# Patient Record
Sex: Female | Born: 1965 | Race: White | Hispanic: No | Smoking: Former smoker
Health system: Southern US, Community
[De-identification: ages and names within clinical notes are randomized; demographics above are authoritative.]

## PROBLEM LIST (undated history)

## (undated) DIAGNOSIS — C4490 Unspecified malignant neoplasm of skin, unspecified: Secondary | ICD-10-CM

## (undated) HISTORY — PX: APPENDECTOMY: SHX54

## (undated) HISTORY — DX: Unspecified malignant neoplasm of skin, unspecified: C44.90

## (undated) HISTORY — PX: SKIN CANCER EXCISION: SHX779

---

## 1998-08-17 ENCOUNTER — Other Ambulatory Visit: Admission: RE | Admit: 1998-08-17 | Discharge: 1998-08-17 | Payer: Self-pay | Admitting: Obstetrics and Gynecology

## 1999-08-28 ENCOUNTER — Other Ambulatory Visit: Admission: RE | Admit: 1999-08-28 | Discharge: 1999-08-28 | Payer: Self-pay | Admitting: Obstetrics and Gynecology

## 2000-08-31 ENCOUNTER — Other Ambulatory Visit: Admission: RE | Admit: 2000-08-31 | Discharge: 2000-08-31 | Payer: Self-pay | Admitting: Obstetrics and Gynecology

## 2001-09-22 ENCOUNTER — Other Ambulatory Visit: Admission: RE | Admit: 2001-09-22 | Discharge: 2001-09-22 | Payer: Self-pay | Admitting: Family Medicine

## 2002-10-17 ENCOUNTER — Encounter (INDEPENDENT_AMBULATORY_CARE_PROVIDER_SITE_OTHER): Payer: Self-pay

## 2002-10-17 ENCOUNTER — Encounter: Payer: Self-pay | Admitting: Family Medicine

## 2002-10-17 ENCOUNTER — Inpatient Hospital Stay (HOSPITAL_COMMUNITY): Admission: RE | Admit: 2002-10-17 | Discharge: 2002-10-21 | Payer: Self-pay | Admitting: Family Medicine

## 2002-10-22 ENCOUNTER — Inpatient Hospital Stay (HOSPITAL_COMMUNITY): Admission: EM | Admit: 2002-10-22 | Discharge: 2002-10-28 | Payer: Self-pay | Admitting: Emergency Medicine

## 2002-10-22 ENCOUNTER — Encounter: Payer: Self-pay | Admitting: General Surgery

## 2002-10-23 ENCOUNTER — Encounter: Payer: Self-pay | Admitting: General Surgery

## 2002-10-26 ENCOUNTER — Encounter: Payer: Self-pay | Admitting: General Surgery

## 2003-01-23 ENCOUNTER — Other Ambulatory Visit: Admission: RE | Admit: 2003-01-23 | Discharge: 2003-01-23 | Payer: Self-pay | Admitting: Gynecology

## 2003-04-18 ENCOUNTER — Ambulatory Visit (HOSPITAL_COMMUNITY): Admission: RE | Admit: 2003-04-18 | Discharge: 2003-04-18 | Payer: Self-pay | Admitting: Gynecology

## 2003-08-21 ENCOUNTER — Encounter (INDEPENDENT_AMBULATORY_CARE_PROVIDER_SITE_OTHER): Payer: Self-pay | Admitting: Specialist

## 2003-08-21 ENCOUNTER — Inpatient Hospital Stay (HOSPITAL_COMMUNITY): Admission: AD | Admit: 2003-08-21 | Discharge: 2003-08-24 | Payer: Self-pay | Admitting: Gynecology

## 2003-10-05 ENCOUNTER — Other Ambulatory Visit: Admission: RE | Admit: 2003-10-05 | Discharge: 2003-10-05 | Payer: Self-pay | Admitting: Gynecology

## 2004-11-08 ENCOUNTER — Ambulatory Visit (HOSPITAL_COMMUNITY): Admission: RE | Admit: 2004-11-08 | Discharge: 2004-11-08 | Payer: Self-pay | Admitting: Gynecology

## 2004-11-08 ENCOUNTER — Other Ambulatory Visit: Admission: RE | Admit: 2004-11-08 | Discharge: 2004-11-08 | Payer: Self-pay | Admitting: Gynecology

## 2005-11-11 ENCOUNTER — Other Ambulatory Visit: Admission: RE | Admit: 2005-11-11 | Discharge: 2005-11-11 | Payer: Self-pay | Admitting: Gynecology

## 2006-05-14 ENCOUNTER — Other Ambulatory Visit: Admission: RE | Admit: 2006-05-14 | Discharge: 2006-05-14 | Payer: Self-pay | Admitting: Gynecology

## 2006-11-18 ENCOUNTER — Other Ambulatory Visit: Admission: RE | Admit: 2006-11-18 | Discharge: 2006-11-18 | Payer: Self-pay | Admitting: Gynecology

## 2006-11-30 ENCOUNTER — Ambulatory Visit (HOSPITAL_COMMUNITY): Admission: RE | Admit: 2006-11-30 | Discharge: 2006-11-30 | Payer: Self-pay | Admitting: Gynecology

## 2006-12-11 ENCOUNTER — Encounter: Admission: RE | Admit: 2006-12-11 | Discharge: 2006-12-11 | Payer: Self-pay | Admitting: Gynecology

## 2007-11-26 ENCOUNTER — Other Ambulatory Visit: Admission: RE | Admit: 2007-11-26 | Discharge: 2007-11-26 | Payer: Self-pay | Admitting: Gynecology

## 2007-12-06 ENCOUNTER — Other Ambulatory Visit: Admission: RE | Admit: 2007-12-06 | Discharge: 2007-12-06 | Payer: Self-pay | Admitting: Gynecology

## 2010-11-08 ENCOUNTER — Other Ambulatory Visit: Admission: RE | Admit: 2010-11-08 | Discharge: 2010-11-08 | Payer: Self-pay | Admitting: Gynecology

## 2010-11-08 ENCOUNTER — Encounter: Admission: RE | Admit: 2010-11-08 | Discharge: 2010-11-08 | Payer: Self-pay | Admitting: Gynecology

## 2010-11-08 ENCOUNTER — Ambulatory Visit: Payer: Self-pay | Admitting: Gynecology

## 2010-11-29 ENCOUNTER — Encounter
Admission: RE | Admit: 2010-11-29 | Discharge: 2010-11-29 | Payer: Self-pay | Source: Home / Self Care | Attending: Gynecology | Admitting: Gynecology

## 2011-01-12 ENCOUNTER — Encounter: Payer: Self-pay | Admitting: Gynecology

## 2011-05-09 NOTE — H&P (Signed)
NAME:  Julia Dunlap, Julia Dunlap                            ACCOUNT NO.:  [id number]   MEDICAL RECORD NO.:  [id number]                   PATIENT TYPE:  INP   LOCATION:  NA                                   FACILITY:  [facility]   PHYSICIAN:  Juan H. Lily Peer, M.D.             DATE OF BIRTH:  1966/07/29   DATE OF ADMISSION:  08/21/2003  DATE OF DISCHARGE:                                HISTORY & PHYSICAL   CHIEF COMPLAINT:  1. Term intrauterine pregnancy.  2. Previous cesarean section, request for elective repeat.  3. Request for elective tubal sterilization.  4. Advanced maternal age.   HISTORY:  The patient is a 45 year old, gravida 2, para 1 (history of twins  past pregnancy, one delivered vaginal and the second delivered by cesarean  section).  By ultrasound corrected estimated date of confinement August 27, 2003.  The patient will be [redacted] weeks gestation at the time of her elective  cesarean section.  The patient had requested that she have a tubal  sterilization the same time.  The risks, benefits, pros, and cons of  sterilization were discussed.  She understood this was permanent.  Her  prenatal course was significant for the fact that because of advanced  maternal age she underwent genetic amniocentesis with normal chromosome  studies demonstrating a normal female.  The patient also had an upper  respiratory tract infection in her third trimester and was treated with  Augmentin.  She had been counseled on numerous occasions because she smoked  a half of a packs of cigarettes per day.  Otherwise her prenatal course was  essentially unremarkable with the exception that she has positive group B  Streptococcus culture documented.   PAST MEDICAL HISTORY:  Patient with one vaginal delivery and one cesarean  section and delivery of twins in 36.  Advanced maternal age.  Smoker of  less than a half a packs of cigarettes per day.   PAST SURGICAL HISTORY:  She has had an appendectomy in the past.   Otherwise  she has been healthy.   ALLERGIES:  She denies any allergies.   REVIEW OF SYSTEMS:  See [place] form.   PHYSICAL EXAMINATION:  VITAL SIGNS:  Blood pressure 130/70.  WEIGHT:  173 pounds.  HEENT:  Unremarkable.  NECK:  Supple.  Trachea midline.  No carotid bruits.  No thyromegaly.  LUNGS:  Clear to auscultation without any rhonchi or wheezes.  HEART:  Regular rate and rhythm.  No murmurs or gallops.  BREASTS:  Exam not done.  ABDOMEN:  Gravid uterus.  Fundal height 3.75 cm.  Vertex presentation by  [facility] maneuver.  PELVIC:  Her cervix was long, closed, and posterior.  EXTREMITIES:  DTRs 1+.  Negative clonus.  Trace edema.   LABORATORY DATA:  Urine with trace protein and negative for glucose.  Prenatal labs with O positive blood type, negative antibiotic screen, VDRL  nonreactive, rubella immune, hepatitis B surface antigen and HIV negative,  alpha-fetoprotein normal, diabetes screen normal, and positive GBS culture.   ASSESSMENT:  A 45 year old, gravida 2, para 1 (last pregnancy twin gestation  with twin A delivered vaginally and twin B by cesarean section).  The  patient is now at 85 weeks estimated gestational age.  Request for elective  repeat cesarean section and request for elective permanent sterilization.  The risks, benefits, pros, cons, and failure rate of both operations were  discussed.  All questions were answered.  Will follow accordingly.   PLAN:  The patient is scheduled for a repeat cesarean section with bilateral  tubal sterilization on Monday, August 21, 2003, at 1 p.m.                                               Juan H. Lily Peer, M.D.    JHF/MEDQ  D:  08/18/2003  T:  08/18/2003  Job:  [id number]

## 2011-05-09 NOTE — Op Note (Signed)
NAME:  Julia Dunlap, Julia Dunlap                            ACCOUNT NO.:  [id number]   MEDICAL RECORD NO.:  [id number]                   PATIENT TYPE:  INP   LOCATION:  0443                                 FACILITY:  [facility]   PHYSICIAN:  Ollen Gross. Vernell Morgans, M.D.              DATE OF BIRTH:  June 27, 1966   DATE OF PROCEDURE:  10/17/2002  DATE OF DISCHARGE:                                 OPERATIVE REPORT   PREOPERATIVE DIAGNOSIS:  Appendicitis.   POSTOPERATIVE DIAGNOSIS:  Contained ruptured appendicitis.   SURGEON:  Ollen Gross. Carolynne Edouard, M.D.   ANESTHESIA:  General endotracheal.   DESCRIPTION OF PROCEDURE:  After informed consent was obtained, the patient  was brought to the operating room and placed in a supine position on the  operating room table.  After adequate induction of general anesthesia, the  patient's abdomen was prepped with Betadine and draped in the usual sterile  manner.  The area below the umbilicus was infiltrated with 0.25% Marcaine.  A small incision was made with the 15 blade knife.  This incision was  carried down through the subcutaneous tissue bluntly using Kelly clamps and  [employer] retractors until the linea alba was identified.  The linea alba  was also incised with the 15 blade knife, and each side was grasped with  Kocher clamps and elevated anteriorly.  The preperitoneal space was then  probed bluntly with a hemostat until the peritoneum was opened and access  was gained to the abdominal cavity.  A 0 Vicryl pursestring stitch was  placed in the fascia surrounding this opening.  A laparoscope was then  placed through this opening and anchored in place with the previously-placed  Vicryl pursestring stitch.  The abdomen was then able to be insufflated  without difficulty with carbon dioxide.  The patient was placed in a  Trendelenburg position with the left side down slightly.  A small stab  incision was made in the suprapubic area with a 15 blade knife, and a 5 mm  port was  then placed bluntly through this incision into the abdominal cavity  under direct vision.  The right lower quadrant was inspected, and an  inflammatory mass was able to be appreciated, but it was obvious that it was  not going to be possible to visualize or mobilize the appendix  laparoscopically.  At this point, the laparoscopic portion of the procedure  was abandoned, and the laparoscope was used to approximate the placement of  an incision in the right lower quadrant for an open appendectomy.  Once this  was done, a right lower quadrant transverse incision was made with a 15  blade knife.  This incision was carried down through the skin and  subcutaneous tissues using the Bovie electrocautery until the fascial layers  of the external oblique were encountered.  These fascial layers and muscle  layers were opened sharply with the electrocautery  and carrying it down  through the internal and transversalis layers as well until the peritoneum  was encountered.  The peritoneum was grasped between hemostats and opened  sharply with the Metzenbaum scissors.  The rest of the incision was then  opened under direct vision with the electrocautery.  The right lower  quadrant was inspected, and there was an obvious inflammatory phlegmon in  this area.  The appendix appeared to be located in a retrocecal position in  the retroperitoneum.  The proximal part of the right colon was mobilized  somewhat from its retroperitoneal attachments sharply with the  electrocautery and then as well as bluntly with some finger dissection.  Once this was accomplished, the course of the appendix was able to be  appreciated.  The appendix was freed up by incising some of the inflammatory  rind around it with the harmonic scalpel.  Once this was accomplished, the  appendix was able to be freed somewhat, and the base of the appendix at its  junction with the cecum was able to be identified.  This area was cleared of  any  inflammatory debris sharply with the harmonic scalpel.  Next, an EndoGIA  stapler for standard tissue was used and applied across the base of the  appendix and clamped and fired.  The appendix was divided and removed from  the patient.  The staple line on the cecum was intact and appeared to be in  good position.  The abdomen was then irrigated with copious amounts of  saline, and the fascial layers were then closed in two layers using running  #1 PDS sutures.  Each layer was irrigated copiously with saline.  The  subcutaneous tissue was then left open and packed with gauze.  The prior  laparoscopic incisions were closed with staples.  Sterile dressings were  applied.  The patient tolerated the procedure well.  At the end of the case,  all needle, sponge, and instrument counts were correct.  The patient was  awakened and taken to the recovery room in stable condition.October 20, 2002                                               Ollen Gross. Vernell Morgans, M.D.    PST/MEDQ  D:  10/21/2002  T:  10/21/2002  Job:  [id number]

## 2011-05-09 NOTE — Discharge Summary (Signed)
   NAME:  Julia Dunlap, Julia Dunlap                            ACCOUNT NO.:  [id number]   MEDICAL RECORD NO.:  [id number]                   PATIENT TYPE:  INP   LOCATION:  0442                                 FACILITY:  [facility]   PHYSICIAN:  Ollen Gross. Vernell Morgans, M.D.              DATE OF BIRTH:  [birth year]   DATE OF ADMISSION:  10/22/2002  DATE OF DISCHARGE:  10/28/2002                                 DISCHARGE SUMMARY   HOSPITAL COURSE:  Briefly, the patient is a 45 year old white female who was  several days status post open appendectomy when she developed some redness  and worsening pain on her right flank.  She was brought back into the  hospital, underwent a CT scan that showed two abscess pockets.  The intra-  abdominal pocket was drained percutaneously.  The abscess pocket in her  anterior abdominal wall communicated with the incision and was able to be  opened through the incision and drained.  She was started on broad-spectrum  antibiotics and was kept on these antibiotics with the drain in place for  several days.  Once her drain output had decreased the drain was moved, and  by October 28, 2002, she was tolerating a diet.  Her drain was out, and she  was ready for discharge home.   DISCHARGE MEDICATIONS:  Cipro.   ACTIVITY:  No heavy lifting.   DIET:  No restrictions.   WOUND CARE:  She is to continue getting home health dressing changes.   FOLLOW-UP:  In one week to Dr. Carolynne Edouard.   FINAL DIAGNOSIS:  Intra-abdominal abscess after open appendectomy.   DISPOSITION:  Home.   CONDITION ON DISCHARGE:  Stable.                                                Ollen Gross. Vernell Morgans, M.D.    PST/MEDQ  D:  11/08/2002  T:  11/08/2002  Job:  [id number]

## 2011-05-09 NOTE — Discharge Summary (Signed)
   NAME:  Julia Dunlap, Julia Dunlap                            ACCOUNT NO.:  [id number]   MEDICAL RECORD NO.:  [id number]                   PATIENT TYPE:  INP   LOCATION:  0443                                 FACILITY:  [facility]   PHYSICIAN:  Ollen Gross. Vernell Morgans, M.D.              DATE OF BIRTH:  [birth year]   DATE OF ADMISSION:  10/17/2002  DATE OF DISCHARGE:  10/21/2002                                 DISCHARGE SUMMARY   HOSPITAL COURSE:  Briefly, the patient is a 45 year old white female who  presented with abdominal pain and was found by CT scan to have evidence of  appendicitis with possible rupture.  She also had a white count of 31,000.  She was taken to the operating room where she underwent an open appendectomy  for what was found to be a ruptured appendicitis.  She tolerated this  procedure well.  Her wound was left open with dressing changes, and she was  placed on broad-spectrum antibiotics.  Over the next couple of days her  white count normalized, and she had continued to improve.  She was  tolerating a diet, and her bowels were moving well, and she was comfortable  with her dressing changes.  On October 21, 2002, she was ready for discharge  home.   FINAL DIAGNOSIS:  Ruptured acute appendicitis.   ACTIVITY:  No heavy lifting.   MEDICATIONS:  She is to resume her home medications.  She was given a  prescription for Vicodin for pain.   CONDITION ON DISCHARGE:  Stable.   FOLLOW-UP:  Will be with Dr. Carolynne Edouard in the next week.   DISPOSITION:  She is ready for discharge home.                                               Ollen Gross. Vernell Morgans, M.D.    PST/MEDQ  D:  11/08/2002  T:  11/08/2002  Job:  [id number]

## 2011-05-09 NOTE — Op Note (Signed)
NAME:  Julia Dunlap, Julia Dunlap                            ACCOUNT NO.:  [id number]   MEDICAL RECORD NO.:  [id number]                   PATIENT TYPE:  INP   LOCATION:  9105                                 FACILITY:  [facility]   PHYSICIAN:  Juan H. Lily Peer, M.D.             DATE OF BIRTH:  08/22/1966   DATE OF PROCEDURE:  08/21/2003  DATE OF DISCHARGE:                                 OPERATIVE REPORT   PREOPERATIVE DIAGNOSIS:  1. Term intrauterine pregnancy.  2. Previous cesarean section requesting elective repeat.  3. Request for permanent sterilization.   POSTOPERATIVE DIAGNOSIS:  1. Term intrauterine pregnancy.  2. Previous cesarean section requesting elective repeat.  3. Request for permanent sterilization.   OPERATION PERFORMED:  1. Repeat lower uterine segment transverse cesarean section.  2. Bilateral tubal sterilization procedure, Pomeroy technique.   SURGEON:  Juan H. Lily Peer, M.D.   ASSISTANT:  Ivor Costa. Farrel Gobble, M.D.   ANESTHESIA:  Epidural.   FINDINGS:  A viable female infant, Apgars of 8 and 9.  Clear amniotic fluid,  normal maternal pelvic anatomy.   INDICATIONS FOR PROCEDURE:  The patient is a 45 year old with previous  cesarean section at time of delivery of twins, first twin delivered  vaginally, second twin via cesarean section.  Patient requesting elective  repeat cesarean section  and elective permanent sterilization.   DESCRIPTION OF PROCEDURE:  After the patient was adequately counseled, she  was taken to the operating room where she underwent a successful spinal  anesthesia.  The abdomen was prepped and draped in the usual sterile  fashion.  A Foley catheter had been inserted in effort to monitor urinary  output.  A Pfannenstiel incision was made adjacent to the previous  Pfannenstiel scar.  The incision was carried down from the skin and  subcutaneous tissue down to the rectus fascia.  A midline nick was made. The  fascia was incised in a transverse fashion.   The peritoneal cavity was  entered cautiously.  The bladder flap was established.  The lower uterine  segment was incised in a transverse fashion.  Clear amniotic fluid was  present.  The newborn's head was delivered, nuchal cord times one was  manually reduced.  After the newborn was delivered, the nasopharyngeal area  was bulb suctioned. The cord was doubly clamped and excised and passed off  to the pediatricians who were in attention who gave the above mentioned  parameters.  The placenta was delivered from the intrauterine cavity and  three-vessel cord.  The uterus was then exteriorized, the intrauterine  cavity was cleared of remaining products of conception.  The uterus was  closed in a single locking stitch fashion with 0 Vicryl suture.  The uterus,  tubes and ovaries were inspected, normal size, shape for pregnancy but no  abnormalities were noted.  The uterus was then placed back into the pelvic  cavity.  The pelvic cavity  was then copiously irrigated with normal saline  solution.  After ascertaining adequate hemostasis, the visceral peritoneum  was not reapproximated but the fascia was closed with a running stitch of 0  Vicryl suture after ascertaining that the sponge and needle count were  correct.  The subcutaneous layers were Bovie cauterized.  The skin was  reapproximated with skin clips, followed by placement of Xeroform gauze and  4 x 8 dressing.  The patient was transferred to recovery room with stable  vital signs.   ESTIMATED BLOOD LOSS:  .   URINE OUTPUT:  .   IV FLUID:  lactated Ringers.   ANTIBIOTICS:  The patient received 2g of Ancef.   Of note, the weight of the newborn was 6 pounds, 6 ounces.                                               Juan H. Lily Peer, M.D.    JHF/MEDQ  D:  08/22/2003  T:  08/22/2003  Job:  [id number]

## 2011-05-09 NOTE — Discharge Summary (Signed)
   NAME:  Julia Dunlap, Julia Dunlap                            ACCOUNT NO.:  [id number]   MEDICAL RECORD NO.:  [id number]                   PATIENT TYPE:  INP   LOCATION:  9105                                 FACILITY:  [facility]   PHYSICIAN:  Juan H. Lily Peer, M.D.             DATE OF BIRTH:  [birth year]   DATE OF ADMISSION:  08/21/2003  DATE OF DISCHARGE:  08/24/2003                                 DISCHARGE SUMMARY   DISCHARGE DIAGNOSES:  1. Intrauterine pregnancy at 39 weeks delivered.  2. History of prior cesarean section, for repeat cesarean section.  3. Desired attempt at permanent sterilization.  4. Advanced maternal age.  5. Status post repeat lower uterine segment transverse cesarean section,     bilateral tubal sterilization procedure Pomeroy technique by Dr. Reynaldo Minium on August 21, 2003.  6. Positive group B strep.   HISTORY:  This is a 36-years-of-age female gravida 2 para 1 (twins) with an  EDC of August 27, 2003.  Prenatal course had been complicated by history  of twin delivery - one delivered vaginally and the second delivered by  cesarean section.  The patient desired a repeat cesarean section; also  desired attempt at permanent sterilization.  Was advanced maternal age and  underwent genetic amniocentesis which revealed normal chromosomes.  Also is  a smoker and had been counseled multiple times to discontinue smoking.  She  also had a history of positive group B strep.   HOSPITAL COURSE:  On August 21, 2003 the patient was admitted and underwent  a repeat lower uterine segment transverse cesarean section and bilateral  tubal sterilization procedure - Pomeroy technique by Dr. Reynaldo Minium and  underwent delivery of a female, Apgars of 8 and 9, weight of 6 pounds 6  ounces.  There were no complications.  Postoperatively the patient remained  afebrile, voiding, in stable condition.  She was discharged to home on  August 24, 2003 and given [facility]  postpartum instructions  and postpartum booklet.   ACCESSORY CLINICAL FINDINGS/LABORATORY DATA:  The patient is O positive,  rubella immune.  On August 22, 2003 hemoglobin was 11.7.   DISPOSITION:  The patient is discharged to home.  Informed to return to the  office in six weeks; if had any problem prior to that time to be seen in the  office.  Given a prescription for Tylox p.r.n. pain.     Susa Loffler, P.A.                    Juan H. Lily Peer, M.D.    TSG/MEDQ  D:  09/22/2003  T:  09/22/2003  Job:  [id number]

## 2012-06-30 ENCOUNTER — Encounter (HOSPITAL_COMMUNITY): Payer: Self-pay | Admitting: Emergency Medicine

## 2012-06-30 ENCOUNTER — Emergency Department (HOSPITAL_COMMUNITY)
Admission: EM | Admit: 2012-06-30 | Discharge: 2012-07-01 | Disposition: A | Discharge status: Home / Self Care | Payer: BC Managed Care – PPO | Attending: Emergency Medicine | Admitting: Emergency Medicine

## 2012-06-30 DIAGNOSIS — R259 Unspecified abnormal involuntary movements: Secondary | ICD-10-CM | POA: Insufficient documentation

## 2012-06-30 DIAGNOSIS — R112 Nausea with vomiting, unspecified: Secondary | ICD-10-CM | POA: Insufficient documentation

## 2012-06-30 DIAGNOSIS — F411 Generalized anxiety disorder: Secondary | ICD-10-CM | POA: Insufficient documentation

## 2012-06-30 DIAGNOSIS — F1020 Alcohol dependence, uncomplicated: Secondary | ICD-10-CM

## 2012-06-30 DIAGNOSIS — R000 Tachycardia, unspecified: Secondary | ICD-10-CM | POA: Insufficient documentation

## 2012-06-30 DIAGNOSIS — IMO0002 Reserved for concepts with insufficient information to code with codable children: Secondary | ICD-10-CM | POA: Insufficient documentation

## 2012-06-30 LAB — COMPREHENSIVE METABOLIC PANEL
ALT: 75 U/L — ABNORMAL HIGH (ref 0–35)
AST: 138 U/L — ABNORMAL HIGH (ref 0–37)
Albumin: 4.5 g/dL (ref 3.5–5.2)
Alkaline Phosphatase: 91 U/L (ref 39–117)
Potassium: 3.2 mEq/L — ABNORMAL LOW (ref 3.5–5.1)
Sodium: 138 mEq/L (ref 135–145)
Total Protein: 8.3 g/dL (ref 6.0–8.3)

## 2012-06-30 LAB — URINE MICROSCOPIC-ADD ON

## 2012-06-30 LAB — CBC WITH DIFFERENTIAL/PLATELET
Basophils Relative: 2 % — ABNORMAL HIGH (ref 0–1)
Eosinophils Absolute: 0 10*3/uL (ref 0.0–0.7)
MCH: 34.3 pg — ABNORMAL HIGH (ref 26.0–34.0)
MCHC: 36 g/dL (ref 30.0–36.0)
Neutrophils Relative %: 54 % (ref 43–77)
Platelets: 271 10*3/uL (ref 150–400)
RBC: 4.72 MIL/uL (ref 3.87–5.11)

## 2012-06-30 LAB — RAPID URINE DRUG SCREEN, HOSP PERFORMED
Amphetamines: NOT DETECTED
Opiates: NOT DETECTED
Tetrahydrocannabinol: NOT DETECTED

## 2012-06-30 LAB — URINALYSIS, ROUTINE W REFLEX MICROSCOPIC
Glucose, UA: NEGATIVE mg/dL
Ketones, ur: 40 mg/dL — AB
Leukocytes, UA: NEGATIVE
Protein, ur: >300 mg/dL — AB

## 2012-06-30 LAB — ETHANOL
Alcohol, Ethyl (B): 388 mg/dL — ABNORMAL HIGH (ref 0–11)
Alcohol, Ethyl (B): 208 mg/dL — ABNORMAL HIGH (ref 0–11)

## 2012-06-30 MED ORDER — SODIUM CHLORIDE 0.9 % IV BOLUS (SEPSIS)
1000.0000 mL | Freq: Once | INTRAVENOUS | Status: AC
  Administered 2012-06-30: 1000 mL via INTRAVENOUS

## 2012-06-30 MED ORDER — POTASSIUM CHLORIDE CRYS ER 20 MEQ PO TBCR
40.0000 meq | EXTENDED_RELEASE_TABLET | Freq: Once | ORAL | Status: AC
  Administered 2012-06-30: 40 meq via ORAL
  Filled 2012-06-30: qty 2

## 2012-06-30 MED ORDER — LORAZEPAM 1 MG PO TABS: 0.0000 mg | ORAL_TABLET | Freq: Two times a day (BID) | ORAL | Status: DC

## 2012-06-30 MED ORDER — LORAZEPAM 2 MG/ML IJ SOLN: 1.0000 mg | Freq: Four times a day (QID) | INTRAMUSCULAR | Status: DC | PRN

## 2012-06-30 MED ORDER — LORAZEPAM 1 MG PO TABS
0.0000 mg | ORAL_TABLET | Freq: Four times a day (QID) | ORAL | Status: DC
  Administered 2012-06-30 – 2012-07-01 (×2): 1 mg via ORAL
  Filled 2012-06-30: qty 1

## 2012-06-30 MED ORDER — ONDANSETRON HCL 4 MG PO TABS
4.0000 mg | ORAL_TABLET | Freq: Three times a day (TID) | ORAL | Status: DC | PRN
  Administered 2012-06-30: 4 mg via ORAL
  Filled 2012-06-30: qty 1

## 2012-06-30 MED ORDER — LORAZEPAM 1 MG PO TABS: 1.0000 mg | ORAL_TABLET | Freq: Four times a day (QID) | ORAL | Status: DC | PRN

## 2012-06-30 MED ORDER — FOLIC ACID 1 MG PO TABS
1.0000 mg | ORAL_TABLET | Freq: Every day | ORAL | Status: DC
  Administered 2012-06-30: 1 mg via ORAL
  Filled 2012-06-30: qty 1

## 2012-06-30 MED ORDER — ACETAMINOPHEN 500 MG PO TABS
1000.0000 mg | ORAL_TABLET | Freq: Once | ORAL | Status: AC
  Administered 2012-06-30: 1000 mg via ORAL
  Filled 2012-06-30: qty 2

## 2012-06-30 MED ORDER — IBUPROFEN 400 MG PO TABS
600.0000 mg | ORAL_TABLET | Freq: Three times a day (TID) | ORAL | Status: DC | PRN
  Administered 2012-06-30: 600 mg via ORAL
  Filled 2012-06-30: qty 2

## 2012-06-30 MED ORDER — LORAZEPAM 1 MG PO TABS
1.0000 mg | ORAL_TABLET | Freq: Once | ORAL | Status: AC
  Filled 2012-06-30: qty 1

## 2012-06-30 MED ORDER — THIAMINE HCL 100 MG/ML IJ SOLN: 100.0000 mg | Freq: Every day | INTRAMUSCULAR | Status: DC

## 2012-06-30 MED ORDER — VITAMIN B-1 100 MG PO TABS
100.0000 mg | ORAL_TABLET | Freq: Every day | ORAL | Status: DC
  Administered 2012-06-30: 100 mg via ORAL
  Filled 2012-06-30: qty 1

## 2012-06-30 MED ORDER — ADULT MULTIVITAMIN W/MINERALS CH
1.0000 | ORAL_TABLET | Freq: Every day | ORAL | Status: DC
  Administered 2012-06-30: 1 via ORAL
  Filled 2012-06-30: qty 1

## 2012-06-30 NOTE — ED Notes (Signed)
Patient states that she is an alcoholic, ready to get help and quit. States her last drink wasn't long ago. States drinks approximately a fifth a day.

## 2012-06-30 NOTE — ED Notes (Signed)
Patient resting quietly, denies any discomfort, tearful at times.  Patient reassured, family at bedside.

## 2012-06-30 NOTE — ED Notes (Signed)
Patient states she vomited x1, but feels fine now.  No evidence of vomit in room.

## 2012-06-30 NOTE — ED Notes (Signed)
Act team member assessing patient at this time.

## 2012-06-30 NOTE — ED Provider Notes (Signed)
History   This chart was scribed for Julia Octave, MD by Julia Dunlap. The patient was seen in room APA15/APA15. Patient's care was started at [facility].     CSN: [id number]  Arrival date & time 06/30/12  Julia Dunlap   First MD Initiated Contact with Patient 06/30/12 1854      No chief complaint on file.   (Consider location/radiation/quality/duration/timing/severity/associated sxs/prior treatment) Patient is a 46 y.o. female presenting with drug/alcohol assessment. The history is provided by the patient. No language interpreter was used.  Drug / Alcohol Assessment Primary symptoms include agitation and intoxication. This is a chronic problem. The current episode started 6 to 12 hours ago. The problem has not changed since onset.Suspected agents include alcohol. Associated symptoms include nausea and vomiting. Pertinent negatives include no fever.   Julia Dunlap is a 46 y.o. female who presents to the Emergency Department needing alcohol detoxication. Patient says that she drinks about 1/5 of liquor daily. Patient's family reports that patient has had at least one episode of emesis today. Patient says she has never quit drinking before. Patient reports that she has been drinking for years. Patient has no other medical problems and does not take any medicines. Patient says that she lives with her husband and that he contributes to her alcohol abuse. Patient denies suicidal or homicidal ideation. Patient denies hallucinations or hearing voices. Patient with  No past medical history on file.  No past surgical history on file.  No family history on file.  History  Substance Use Topics  . Smoking status: Not on file  . Smokeless tobacco: Not on file  . Alcohol Use: Not on file    OB History    No data available      Review of Systems  Constitutional: Negative for fever.  Gastrointestinal: Positive for nausea and vomiting.  Psychiatric/Behavioral: Positive for agitation.    Allergies    Review of patient's allergies indicates not on file.  Home Medications  No current outpatient prescriptions on file.  There were no vitals taken for this visit.  Physical Exam  Nursing note and vitals reviewed. Constitutional: She is oriented to person, place, and time. She appears well-developed and well-nourished.       Anxious  HENT:  Head: Normocephalic and atraumatic.  Eyes: Conjunctivae and EOM are normal. Pupils are equal, round, and reactive to light.  Neck: Normal range of motion. Neck supple.  Cardiovascular: Normal rate and regular rhythm.   Pulmonary/Chest: Effort normal and breath sounds normal.  Abdominal: Soft. Bowel sounds are normal.  Musculoskeletal: Normal range of motion.  Neurological: She is alert and oriented to person, place, and time. She displays tremor. No cranial nerve deficit or sensory deficit. Coordination normal.       Mild tremors. No asterixis. No focal neural deficits. No nystagmus. No ankle clonus.  Skin: Skin is warm and dry.  Psychiatric: Her mood appears anxious.    ED Course  Procedures (including critical care time) DIAGNOSTIC STUDIES: Oxygen Saturation is 99% on room air, normal by my interpretation.    COORDINATION OF CARE: 7:15PM- Patient informed of current plan for treatment and evaluation and agrees with plan at this time.  Results for orders placed during the hospital encounter of 06/30/12  CBC WITH DIFFERENTIAL      Component Value Range   WBC 8.0  4.0 - 10.5 K/uL   RBC 4.72  3.87 - 5.11 MIL/uL   Hemoglobin 16.2 (*) 12.0 - 15.0 g/dL   HCT 45.0  36.0 - 46.0 %   MCV 95.3  78.0 - 100.0 fL   MCH 34.3 (*) 26.0 - 34.0 pg   MCHC 36.0  30.0 - 36.0 g/dL   RDW 72.5  36.6 - 44.0 %   Platelets 271  150 - 400 K/uL   Neutrophils Relative 54  43 - 77 %   Neutro Abs 4.3  1.7 - 7.7 K/uL   Lymphocytes Relative 37  12 - 46 %   Lymphs Abs 3.0  0.7 - 4.0 K/uL   Monocytes Relative 7  3 - 12 %   Monocytes Absolute 0.6  0.1 - 1.0 K/uL    Eosinophils Relative 1  0 - 5 %   Eosinophils Absolute 0.0  0.0 - 0.7 K/uL   Basophils Relative 2 (*) 0 - 1 %   Basophils Absolute 0.1  0.0 - 0.1 K/uL  COMPREHENSIVE METABOLIC PANEL      Component Value Range   Sodium 138  135 - 145 mEq/L   Potassium 3.2 (*) 3.5 - 5.1 mEq/L   Chloride 94 (*) 96 - 112 mEq/L   CO2 17 (*) 19 - 32 mEq/L   Glucose, Bld 98  70 - 99 mg/dL   BUN 11  6 - 23 mg/dL   Creatinine, Ser 3.47  0.50 - 1.10 mg/dL   Calcium 9.4  8.4 - 42.5 mg/dL   Total Protein 8.3  6.0 - 8.3 g/dL   Albumin 4.5  3.5 - 5.2 g/dL   AST 956 (*) 0 - 37 U/L   ALT 75 (*) 0 - 35 U/L   Alkaline Phosphatase 91  39 - 117 U/L   Total Bilirubin 0.6  0.3 - 1.2 mg/dL   GFR calc non Af Amer >90  >90 mL/min   GFR calc Af Amer >90  >90 mL/min  URINE RAPID DRUG SCREEN (HOSP PERFORMED)      Component Value Range   Opiates NONE DETECTED  NONE DETECTED   Cocaine NONE DETECTED  NONE DETECTED   Benzodiazepines NONE DETECTED  NONE DETECTED   Amphetamines NONE DETECTED  NONE DETECTED   Tetrahydrocannabinol NONE DETECTED  NONE DETECTED   Barbiturates NONE DETECTED  NONE DETECTED  ETHANOL      Component Value Range   Alcohol, Ethyl (B) 388 (*) 0 - 11 mg/dL  URINALYSIS, ROUTINE W REFLEX MICROSCOPIC      Component Value Range   Color, Urine AMBER (*) YELLOW   APPearance CLEAR  CLEAR   Specific Gravity, Urine 1.025  1.005 - 1.030   pH 6.0  5.0 - 8.0   Glucose, UA NEGATIVE  NEGATIVE mg/dL   Hgb urine dipstick MODERATE (*) NEGATIVE   Bilirubin Urine SMALL (*) NEGATIVE   Ketones, ur 40 (*) NEGATIVE mg/dL   Protein, ur >387 (*) NEGATIVE mg/dL   Urobilinogen, UA 0.2  0.0 - 1.0 mg/dL   Nitrite NEGATIVE  NEGATIVE   Leukocytes, UA NEGATIVE  NEGATIVE  PREGNANCY, URINE      Component Value Range   Preg Test, Ur NEGATIVE  NEGATIVE  URINE MICROSCOPIC-ADD ON      Component Value Range   Squamous Epithelial / LPF MANY (*) RARE   WBC, UA 3-6  <3 WBC/hpf   RBC / HPF 0-2  <3 RBC/hpf   Bacteria, UA FEW (*) RARE     No results found.   No diagnosis found.    MDM  History of alcohol abuse requesting detox. Drinks a fifth of liquor daily for several  years. Last drink just prior to arrival. No history of seizures.  Tachycardic, mildly tremulous on exam, anxious  Labs, Alcohol withdrawal protocol, d/w ACT team.  D/w Samson Frederic.  She will work on placement but suspects alcohol level will need to be less than 100 before patient can be placed.   Date: 06/30/2012  Rate: 116  Rhythm: sinus tachycardia  QRS Axis: normal  Intervals: normal  ST/T Wave abnormalities: normal  Conduction Disutrbances:none  Narrative Interpretation: rate faster  Old EKG Reviewed: changes noted    I personally performed the services described in this documentation, which was scribed in my presence.  The recorded information has been reviewed and considered.    Julia Octave, MD 06/30/12 2052

## 2012-06-30 NOTE — ED Notes (Signed)
Patients belongings removed and placed in locker patient placed in paper scrubs.  Room secured. Family at bedside at this time. No needs or complaints voiced by patient at this time. Asked patient to obtain a urine sample, patient responded "in a little bit". Security contacted to wand patient at this time.

## 2012-06-30 NOTE — ED Notes (Signed)
Patient states she is agreeable to plan of detox.

## 2012-06-30 NOTE — BH Assessment (Signed)
Assessment Note   Julia Dunlap is an 46 y.o. female. PT PRESENTS REQUESTING ETOH DETOX  AFTER BEING DEPENDENT ON FOR SEVERAL YEARS. PT EXPRESSED SHE WAS OFF TODAY & COULD NOT GET HERSELF TOGETHER TO TAKE HER CHILDREN TO W[facility]-N-WILD DUE TO HER DRINKING. PT DENIES ANY HX OF TX OR IDEATION & IS ABLE TO CONTRACT FOR SAFETY. PT ADMITS DRINKING AT LEAST A PINT TO 5TH DAILY & LAST DRINK WAS 06/30/12 WHICH SHE DRANK A PINT. PARENTS ARE SUPPORTIVE. PT HAS BEEN REFERRED TO [facility]; PENDING DISPOSITION. PT EXPRESSED THAT SHE HAD TO WORK & IS INDECISIVE IF SHE STILL WANT DETOX. PT'S BAL IS 388 & [facility] WANTS IT BE UNDER 200 BEFORE ACCEPTED.  Axis I: Anxiety Disorder NOS, Substance Induced Mood Disorder and ALCOHOL DEPEENDENCE Axis II: Deferred Axis III: History reviewed. No pertinent past medical history. Axis IV: other psychosocial or environmental problems, problems related to social environment and problems with primary support group Axis V: 41-50 serious symptoms  Past Medical History: History reviewed. No pertinent past medical history.  Past Surgical History  Procedure Date  . Appendectomy   . Cesarean section     Family History: History reviewed. No pertinent family history.  Social History:  reports that she has been smoking Cigarettes.  She does not have any smokeless tobacco history on file. She reports that she drinks alcohol. She reports that she does not use illicit drugs.  Additional Social History:  Alcohol / Drug Use History of alcohol / drug use?: Yes Substance #1 Name of Substance 1: ETOH(LIQUOR) 1 - Age of First Use: 16 1 - Amount (size/oz): PINT TO 5TH  1 - Frequency: DAILY 1 - Duration: ON GOING 1 - Last Use / Amount: 06/30/12 (PINT)  CIWA: CIWA-Ar BP: 171/96 mmHg Pulse Rate: 122  Nausea and Vomiting: no nausea and no vomiting Tactile Disturbances: none Tremor: not visible, but can be felt fingertip to fingertip Auditory Disturbances: not present Paroxysmal  Sweats: no sweat visible Visual Disturbances: not present Anxiety: mildly anxious Headache, Fullness in Head: very mild Agitation: normal activity Orientation and Clouding of Sensorium: cannot do serial additions or is uncertain about date CIWA-Ar Total: 4  COWS:    Allergies: No Known Allergies  Home Medications:  (Not in a hospital admission)  OB/GYN Status:  Patient's last menstrual period was 06/09/2012.  General Assessment Data Location of Assessment: [facility] ACT Assessment: Yes Living Arrangements: Spouse/significant other;Children Can pt return to current living arrangement?: Yes Admission Status: Voluntary Is patient capable of signing voluntary admission?: Yes Transfer from: Acute Hospital Referral Source: Self/Family/Friend     Risk to self Suicidal Ideation: No Suicidal Intent: No Is patient at risk for suicide?: No Suicidal Plan?: No Access to Means: No What has been your use of drugs/alcohol within the last 12 months?: PT ADMITS TO DRINKING ABOUT A PINT TO A 5TH DAILY & LAST DRINK WAS 06/30/12 Previous Attempts/Gestures: No How many times?: 0  Other Self Harm Risks: NA Triggers for Past Attempts: Family contact;Spouse contact;Other personal contacts;Unpredictable;Other (Comment) (SA) Intentional Self Injurious Behavior: None Family Suicide History: No Recent stressful life event(s): Conflict (Comment);Divorce;Loss (Comment) Persecutory voices/beliefs?: No Depression: No Depression Symptoms: Loss of interest in usual pleasures;Guilt;Feeling worthless/self pity Substance abuse history and/or treatment for substance abuse?: Yes Suicide prevention information given to non-admitted patients: Not applicable  Risk to Others Homicidal Ideation: No Thoughts of Harm to Others: No Current Homicidal Intent: No Current Homicidal Plan: No Access to Homicidal Means:  No Identified Victim: NA History of harm to others?: No Assessment of Violence: None Noted Violent  Behavior Description: COOPERATIVE Does patient have access to weapons?: No Criminal Charges Pending?: No Does patient have a court date: No  Psychosis Hallucinations: None noted Delusions: None noted  Mental Status Report Appear/Hygiene: Improved Eye Contact: Good Motor Activity: Freedom of movement Speech: Logical/coherent Level of Consciousness: Alert Mood: Anxious;Anhedonia;Ambivalent Affect: Anxious;Appropriate to circumstance Anxiety Level: Minimal Thought Processes: Coherent;Relevant Judgement: Impaired Orientation: Person;Place;Time;Situation Obsessive Compulsive Thoughts/Behaviors: None  Cognitive Functioning Concentration: Decreased Memory: Recent Intact;Remote Intact IQ: Average Insight: Poor Impulse Control: Poor Appetite: Poor Weight Loss: 0  Weight Gain: 0  Sleep: Decreased Total Hours of Sleep: 2  Vegetative Symptoms: None  ADLScreening [facility] Assessment Services) Patient's cognitive ability adequate to safely complete daily activities?: Yes Patient able to express need for assistance with ADLs?: Yes Independently performs ADLs?: Yes  Abuse/Neglect [facility]) Physical Abuse: Denies Verbal Abuse: Denies Sexual Abuse: Denies  Prior Inpatient Therapy Prior Inpatient Therapy: No Prior Therapy Dates: NA Prior Therapy Facilty/Provider(s): NA Reason for Treatment: NA  Prior Outpatient Therapy Prior Outpatient Therapy: No Prior Therapy Dates: NA Prior Therapy Facilty/Provider(s): NA Reason for Treatment: NA  ADL Screening (condition at time of admission) Patient's cognitive ability adequate to safely complete daily activities?: Yes Patient able to express need for assistance with ADLs?: Yes Independently performs ADLs?: Yes       Abuse/Neglect Assessment (Assessment to be complete while patient is alone) Physical Abuse: Denies Verbal Abuse: Denies Sexual Abuse: Denies Values / Beliefs Cultural Requests During Hospitalization: None Spiritual  Requests During Hospitalization: None        Additional Information 1:1 In Past 12 Months?: No CIRT Risk: No Elopement Risk: No Does patient have medical clearance?: Yes     Disposition:  Disposition Disposition of Patient: Inpatient treatment program;Referred to ([facility]) Type of inpatient treatment program: Adult  On Site Evaluation by:   Reviewed with Physician:     Waldron Session 06/30/2012 10:06 PM

## 2012-07-01 ENCOUNTER — Encounter (HOSPITAL_COMMUNITY): Payer: Self-pay | Admitting: *Deleted

## 2012-07-01 ENCOUNTER — Inpatient Hospital Stay (HOSPITAL_COMMUNITY)
Admission: AD | Admit: 2012-07-01 | Discharge: 2012-07-04 | DRG: 750 | Disposition: A | Discharge status: Home / Self Care | Payer: BC Managed Care – PPO | Source: Ambulatory Visit | Attending: Psychiatry | Admitting: Psychiatry

## 2012-07-01 DIAGNOSIS — F10939 Alcohol use, unspecified with withdrawal, unspecified: Principal | ICD-10-CM | POA: Diagnosis present

## 2012-07-01 DIAGNOSIS — Z79899 Other long term (current) drug therapy: Secondary | ICD-10-CM

## 2012-07-01 DIAGNOSIS — F1020 Alcohol dependence, uncomplicated: Secondary | ICD-10-CM

## 2012-07-01 DIAGNOSIS — F17200 Nicotine dependence, unspecified, uncomplicated: Secondary | ICD-10-CM | POA: Diagnosis present

## 2012-07-01 DIAGNOSIS — E876 Hypokalemia: Secondary | ICD-10-CM | POA: Diagnosis not present

## 2012-07-01 DIAGNOSIS — F10239 Alcohol dependence with withdrawal, unspecified: Principal | ICD-10-CM | POA: Diagnosis present

## 2012-07-01 LAB — ETHANOL: Alcohol, Ethyl (B): 157 mg/dL — ABNORMAL HIGH (ref 0–11)

## 2012-07-01 MED ORDER — CHLORDIAZEPOXIDE HCL 25 MG PO CAPS: 25.0000 mg | ORAL_CAPSULE | Freq: Four times a day (QID) | ORAL | Status: AC | PRN

## 2012-07-01 MED ORDER — MAGNESIUM HYDROXIDE 400 MG/5ML PO SUSP: 30.0000 mL | Freq: Every day | ORAL | Status: DC | PRN

## 2012-07-01 MED ORDER — LOPERAMIDE HCL 2 MG PO CAPS: 2.0000 mg | ORAL_CAPSULE | ORAL | Status: AC | PRN

## 2012-07-01 MED ORDER — NICOTINE 21 MG/24HR TD PT24
21.0000 mg | MEDICATED_PATCH | Freq: Every day | TRANSDERMAL | Status: DC
  Administered 2012-07-01 – 2012-07-02 (×2): 21 mg via TRANSDERMAL
  Filled 2012-07-01 (×6): qty 1

## 2012-07-01 MED ORDER — ONDANSETRON 4 MG PO TBDP: 4.0000 mg | ORAL_TABLET | Freq: Four times a day (QID) | ORAL | Status: AC | PRN

## 2012-07-01 MED ORDER — CHLORDIAZEPOXIDE HCL 25 MG PO CAPS
25.0000 mg | ORAL_CAPSULE | ORAL | Status: AC
  Administered 2012-07-03 – 2012-07-04 (×2): 25 mg via ORAL
  Filled 2012-07-01 (×2): qty 1

## 2012-07-01 MED ORDER — ADULT MULTIVITAMIN W/MINERALS CH
1.0000 | ORAL_TABLET | Freq: Every day | ORAL | Status: DC
  Administered 2012-07-01 – 2012-07-04 (×4): 1 via ORAL
  Filled 2012-07-01 (×6): qty 1

## 2012-07-01 MED ORDER — VITAMIN B-1 100 MG PO TABS
100.0000 mg | ORAL_TABLET | Freq: Every day | ORAL | Status: DC
  Administered 2012-07-01 – 2012-07-04 (×4): 100 mg via ORAL
  Filled 2012-07-01 (×5): qty 1

## 2012-07-01 MED ORDER — CHLORDIAZEPOXIDE HCL 25 MG PO CAPS
25.0000 mg | ORAL_CAPSULE | Freq: Three times a day (TID) | ORAL | Status: AC
  Administered 2012-07-02 – 2012-07-03 (×3): 25 mg via ORAL
  Filled 2012-07-01 (×3): qty 1

## 2012-07-01 MED ORDER — ALUM & MAG HYDROXIDE-SIMETH 200-200-20 MG/5ML PO SUSP
30.0000 mL | ORAL | Status: DC | PRN
  Administered 2012-07-02: 30 mL via ORAL

## 2012-07-01 MED ORDER — HYDROXYZINE HCL 25 MG PO TABS: 25.0000 mg | ORAL_TABLET | Freq: Four times a day (QID) | ORAL | Status: AC | PRN

## 2012-07-01 MED ORDER — POTASSIUM CHLORIDE CRYS ER 20 MEQ PO TBCR
20.0000 meq | EXTENDED_RELEASE_TABLET | Freq: Two times a day (BID) | ORAL | Status: DC
  Administered 2012-07-01 – 2012-07-03 (×4): 20 meq via ORAL
  Filled 2012-07-01 (×6): qty 1

## 2012-07-01 MED ORDER — LISINOPRIL 10 MG PO TABS
10.0000 mg | ORAL_TABLET | Freq: Every day | ORAL | Status: DC
  Administered 2012-07-01 – 2012-07-04 (×4): 10 mg via ORAL
  Filled 2012-07-01 (×7): qty 1

## 2012-07-01 MED ORDER — CHLORDIAZEPOXIDE HCL 25 MG PO CAPS: 25.0000 mg | ORAL_CAPSULE | Freq: Every day | ORAL | Status: DC

## 2012-07-01 MED ORDER — NAPHAZOLINE HCL 0.1 % OP SOLN
1.0000 [drp] | Freq: Four times a day (QID) | OPHTHALMIC | Status: DC | PRN
  Filled 2012-07-01: qty 15

## 2012-07-01 MED ORDER — THIAMINE HCL 100 MG/ML IJ SOLN: 100.0000 mg | Freq: Once | INTRAMUSCULAR | Status: AC

## 2012-07-01 MED ORDER — TRAZODONE HCL 50 MG PO TABS
50.0000 mg | ORAL_TABLET | Freq: Every evening | ORAL | Status: DC | PRN
  Administered 2012-07-01: 50 mg via ORAL
  Filled 2012-07-01: qty 1

## 2012-07-01 MED ORDER — CHLORDIAZEPOXIDE HCL 25 MG PO CAPS
25.0000 mg | ORAL_CAPSULE | Freq: Four times a day (QID) | ORAL | Status: AC
  Administered 2012-07-01 – 2012-07-02 (×6): 25 mg via ORAL
  Filled 2012-07-01 (×6): qty 1

## 2012-07-01 MED ORDER — CHLORDIAZEPOXIDE HCL 25 MG PO CAPS
25.0000 mg | ORAL_CAPSULE | Freq: Once | ORAL | Status: AC
  Administered 2012-07-01: 25 mg via ORAL
  Filled 2012-07-01: qty 1

## 2012-07-01 NOTE — Progress Notes (Signed)
Recreation Therapy Group Note  Date: 07/01/2012          Time: 1500      Group Topic/Focus: The focus of this group is on enhancing patients' problem solving skills, which involves identifying the problem, brainstorming solutions and choosing and trying a solution.    Participation Level: Did not attend  Participation Quality: Not Applicable  Affect: Not Applicable  Cognitive: Not Applicable   Additional Comments: Patient permitted to miss group due to low blood pressure.  Veera Stapleton 07/01/2012 3:59 PM

## 2012-07-01 NOTE — Discharge Planning (Signed)
New patient is here for alcohol detox-has never gotten help before.  Works in Clinical biochemist for [employer].  Married with 3 children at home.  C/O withdrawal symptoms, but putting on a good face.  Plans to return home, follow up outpt.

## 2012-07-01 NOTE — Progress Notes (Signed)
Psychoeducational Group Note  Date:  07/01/2012 Time:  1100  Group Topic/Focus:  Rediscovering Joy:   The focus of this group is to explore various ways to relieve stress in a positive manner.  Participation Level:  Active  Participation Quality:  Appropriate  Affect:  Appropriate  Cognitive:  Appropriate  Insight:  Good  Engagement in Group:  Good  Additional Comments: none  Frankee Gritz M 07/01/2012, 2:10 PM

## 2012-07-01 NOTE — [facility] Suicide Risk Assessment (Signed)
Suicide Risk Assessment  Admission Assessment     Demographic factors:  Assessment Details Time of Assessment: Admission Information Obtained From: Patient Current Mental Status:    Loss Factors:  Loss Factors: Financial problems / change in socioeconomic status Historical Factors:    Risk Reduction Factors:  Risk Reduction Factors: Responsible for children under 46 years of age;Sense of responsibility to family;Employed;Living with another person, especially a relative;Positive social support;Positive coping skills or problem solving skills  CLINICAL FACTORS:   Alcohol/Substance Abuse/Dependencies Previous Psychiatric Diagnoses and Treatments  Diagnosis:  Axis I: Alcohol Dependence.   The patient was seen today and reports the following:   ADL's: Intact.  Sleep: The patient reports to having difficulty initiating and maintaining sleep.  Appetite: The patient reports a decreased appetite today.   Mild>(1-10) >Severe  Hopelessness (1-10): 0  Depression (1-10): 2  Anxiety (1-10): 0   Suicidal Ideation: The patient adamantly denies any suicidal ideations today.  Plan: No  Intent: No  Means: No   Homicidal Ideation: The patient adamantly denies any homicidal ideations today.  Plan: No  Intent: No.  Means: No   General Appearance/Behavior: The patient was friendly and cooperative today with this provider.  Eye Contact: Good.  Speech: Appropriate in rate and volume today with no pressuring noted.  Motor Behavior: wnl.  Level of Consciousness: Alert and Oriented x 3.  Mental Status: Alert and Oriented x 3.  Mood: Appears mildly depressed today.  Affect: Appears mildly constricted.  Anxiety Level: No anxiety reported today.  Thought Process: wnl.  Thought Content: The patient denies any auditory or visual hallucinations today. She also denies any delusional thinking.  Perception: wnl  Judgment: Good.  Insight: Good.  Cognition: Oriented to person, place and time.    Current Medications:     . chlordiazePOXIDE  25 mg Oral Once  . chlordiazePOXIDE  25 mg Oral QID   Followed by  . chlordiazePOXIDE  25 mg Oral TID   Followed by  . chlordiazePOXIDE  25 mg Oral BH-qamhs   Followed by  . chlordiazePOXIDE  25 mg Oral Daily  . lisinopril  10 mg Oral Daily  . multivitamin with minerals  1 tablet Oral Daily  . nicotine  21 mg Transdermal Q0600  . potassium chloride  20 mEq Oral BID  . thiamine  100 mg Intramuscular Once  . thiamine  100 mg Oral Daily   Review of Systems:  Neurological: The patient denies any headaches today. She denies any seizures or dizziness.  G.I.: The patient denies any constipation or G.I. Upset today.  Musculoskeletal: The patient denies any musculoskeletal issues today.   Time was spent today discussing with the patient her reason for admission. The patient states that she has been drinking 1 pint of liquor per day during the week and 1 5th of liquor per day on weekends.  She states today that she is ready to stop.  She denies any use of illicit drugs. She reports that she is having significant difficulty initiating and maintaining sleep. She reports his appetite is decreased and she report mild feelings of sadness, anhedonia and depressed mood. She denies any suicidal or homicidal ideations and denies any anxiety symptoms today. The patient denies any auditory or visual hallucinations or delusional thinking today.   Treatment Plan Summary:  1. Daily contact with patient to assess and evaluate symptoms and progress in treatment.  2. Medication management  3. The patient will deny suicidal ideations or homicidal ideations for 48 hours  prior to discharge and have a depression and anxiety rating of 3 or less. The patient will also deny any auditory or visual hallucinations or delusional thinking.  4. The patient will deny any symptoms of substance withdrawal at time of discharge.   Plan:  1. Will continue the Librium Detox protocol  which was ordered for alcohol detox. 2. Will start the medication Trazodone 50 mgs po qhs - prn for sleep.  3. Will start KCL 20 mEq po q am and hs x 6 doses for hypokalemia.  4. Laboratory studies reviewed.  5. Will order a repeat CMP for this evening.  6. Will continue to monitor.   COGNITIVE FEATURES THAT CONTRIBUTE TO RISK:  None Noted.   SUICIDE RISK:   Minimal: No identifiable suicidal ideation.  Patients presenting with no risk factors but with morbid ruminations; may be classified as minimal risk based on the severity of the depressive symptoms  Julia Dunlap 07/01/2012, 4:05 PM

## 2012-07-01 NOTE — Treatment Plan (Signed)
Interdisciplinary Treatment Plan Update (Adult)  Date: 07/01/2012  Time Reviewed: 1:58 PM   Progress in Treatment: Attending groups: Yes Participating in groups: Yes Taking medication as prescribed: Yes Tolerating medication: Yes   Family/Significant other contact made:  Not yet Patient understands diagnosis:  Yes  As evidenced by asking for help with alcoholism Discussing patient identified problems/goals with staff:  Yes  See below Medical problems stabilized or resolved:  Yes Denies suicidal/homicidal ideation: Yes  In AM group Issues/concerns per patient self-inventory:  None noted Other:  New problem(s) identified: N/A  Reason for Continuation of Hospitalization: Depression Medication stabilization Withdrawal symptoms  Interventions implemented related to continuation of hospitalization: Librium taper  Encourage group attendance and participation   Assess for mood stabilizer  Additional comments:  Estimated length of stay:2-3 days  Discharge Plan: Return home, follow up outpt  New goal(s): N/A  Review of initial/current patient goals per problem list:   1.  Goal(s): Safely detox from alcohol  Met:  No  Target date:7/13  As evidenced ZO:[id number] vitals, no withdrawal symptoms  2.  Goal (s): Eliminate SI  Met:  Yes  Target date7/11:  As evidenced VW:[id number] report  3.  Goal(s):Decrease depression  Met:  No  Target date:7/13  As evidenced by:Will rate depression at 3 or less  4.  Goal(s): Identify comprehensive sobriety plan  Met:  No  Target date:7/12  As evidenced XB:JYNW report  Attendees: Patient:     Family:     Physician:  Lupe Carney 07/01/2012 1:58 PM   Nursing:    07/01/2012 1:58 PM   Case Manager:  Richelle Ito, LCSW 07/01/2012 1:58 PM   Counselor:   07/01/2012 1:58 PM   Other:     Other:     Other:     Other:      Scribe for Treatment Team:   Ida Rogue, 07/01/2012 1:58 PM

## 2012-07-01 NOTE — Progress Notes (Signed)
[facility] Group Notes:  (Counselor/Nursing/MHT/Case Management/Adjunct)  07/01/2012 6:22 PM  Type of Therapy:  Group Therapy at 1:15 to 2:30   Participation Level:  Minimal  Participation Quality:  Appropriate  Affect:  Appropriate  Cognitive:  Alert and Oriented  Insight:  Good  Engagement in Group:  Limited  Engagement in Therapy:  Limited  Modes of Intervention:  Activity, Clarification, Socialization and Support  Summary of Progress/Problems: Julia Dunlap attended her first group therapy session and  participated in activity in which patients choose photographs to represent what their life would look and feel like were it in balance and another for out of balance. Julia Dunlap choose a photo of the shore at sunset to represent balance and padlocked doors to represent out of balance. Julia Dunlap identified that alcohol keeps her shut behind the doors and controls her life.  Patient was obviously tremulous at multiple points today.    Clide Dales 07/01/2012, 6:22 PM

## 2012-07-01 NOTE — Progress Notes (Signed)
D:  Patient up and attending groups today.  Interacting well with staff and peers.  Blood pressure has been elevated today.  Provider aware.  CIWA scores have been 6 to 8 today.   A:  Librium given as scheduled.  Encouraged participation in groups.  Encouraged fluids.  Provider notified of continued elevation of blood pressure.   R:  Pleasant and cooperative.  Patient is experiencing alcohol withdrawal, but Librium has been effective thus far.  Will continue to monitor blood pressure closely.

## 2012-07-01 NOTE — Progress Notes (Signed)
Patient ID: Julia Dunlap, female   DOB: [birth year], 46 y.o.   MRN: [id number]  Pt was pleasant and cooperative, but anxious and tearful during the adm process. Prior to the search room, pt vomited in the trash can.  Pt was accompanied by her mother in the consult room.  Report states pt drinks a 5th of alcohol daily. However, pt stated that she drinks a 5th on weekends, but drinks a pint on weekdays. When asked what caused her to admit to [facility] pt stated, "I just decided it was time to stop". Pt stated that her husband is not really supportive, because he drinks.  Pt informed the writer that her husband, "will probably be next". While discussing issues, pt became sick and vomited in the trash can and bathroom. Pt was given zofran on adm. Pt denied SI, HI, A/V.

## 2012-07-01 NOTE — ED Notes (Signed)
Patient transported by [facility] to [facility].

## 2012-07-01 NOTE — Tx Team (Signed)
Initial Interdisciplinary Treatment Plan  PATIENT STRENGTHS: (choose at least two) Ability for insight Active sense of humor Average or above average intelligence Capable of independent living Communication skills General fund of knowledge Motivation for treatment/growth Physical Health Religious Affiliation Supportive family/friends Work skills  PATIENT STRESSORS: Financial difficulties Occupational concerns Substance abuse   PROBLEM LIST: Problem List/Patient Goals Date to be addressed Date deferred Reason deferred Estimated date of resolution  "I want to quit drinking and feel better" 07/01/12           "I want to be a better mother" 07/01/12     Depression 04/01/12     Alcohol abuse 07/01/12     Increased risk of suicide 07/01/12                        DISCHARGE CRITERIA:  Ability to meet basic life and health needs Adequate post-discharge living arrangements Improved stabilization in mood, thinking, and/or behavior Medical problems require only outpatient monitoring Motivation to continue treatment in a less acute level of care Need for constant or close observation no longer present Reduction of life-threatening or endangering symptoms to within safe limits Safe-care adequate arrangements made Verbal commitment to aftercare and medication compliance Withdrawal symptoms are absent or subacute and managed without 24-hour nursing intervention  PRELIMINARY DISCHARGE PLAN: Attend aftercare/continuing care group Attend 12-step recovery group Outpatient therapy Participate in family therapy Return to previous living arrangement  PATIENT/FAMIILY INVOLVEMENT: This treatment plan has been presented to and reviewed with the patient, Julia Dunlap, and/or family member.  The patient and family have been given the opportunity to ask questions and make suggestions.  Fransico Michael [facility] 07/01/2012, 4:54 AM

## 2012-07-01 NOTE — ED Notes (Signed)
Report called to [facility] with [facility]. Stated was approximately 25 minutes away from picking up patient.

## 2012-07-01 NOTE — Progress Notes (Signed)
[facility] Case Management Discharge Plan:  Will you be returning to the same living situation after discharge: Yes,  home At discharge, do you have transportation home?:Yes,  husband Do you have the ability to pay for your medications:Yes,  no meds  Interagency Information:     Release of information consent forms completed and in the chart;  Patient's signature needed at discharge.  Patient to Follow up at:  Follow-up Information    Follow up with Follow up with your EAP provider.      Follow up with Attend AA mtgs in [place], get a sponser.         Patient denies SI/HI:   Yes,  yes    Safety Planning and Suicide Prevention discussed:  Yes,  yes  Barrier to discharge identified:No.  Summary and Recommendations:   Julia Dunlap 07/01/2012, 3:50 PM

## 2012-07-01 NOTE — ED Notes (Signed)
Accepting physician Dr. Rae Halsted to [facility]

## 2012-07-01 NOTE — Progress Notes (Signed)
0207 Repeat ETOH is 157. Patient has been accepted at [facility] by Dr. Elmon Kirschner.
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
## 2012-07-01 NOTE — Progress Notes (Signed)
D: Pt. Has participated appropriately in all unit activities;pleasant & cooperative;BP still elevated--A:Pt was started on lisinopril @1600 - 1700 med pass. Fluids encouraged--gatorade given.Pt.doing well on librium detox protocol.R: contracts for safety on the unit.Continues on 15 minute checks R:pt. Safety maintained.

## 2012-07-02 MED ORDER — CITALOPRAM HYDROBROMIDE 20 MG PO TABS
20.0000 mg | ORAL_TABLET | Freq: Every day | ORAL | Status: DC
  Administered 2012-07-02 – 2012-07-04 (×3): 20 mg via ORAL
  Filled 2012-07-02 (×8): qty 1

## 2012-07-02 NOTE — Progress Notes (Signed)
[facility] Adult Inpatient Family/Significant Other Collateral Contact  Collateral Contact:  Husband, [place] Kosch at [phone] has been identified by the patient as the family member/significant other who can provide for collateral information. With written consent from the patient, the family member/significant other has been contacted and reports:   That he has no collateral information to offer. It was his belief that "She just drank a little too much while we were off work last week.".   Husband was supportive of patient discharging over weekend in order to return to work next week.  Writer provided   [employer] Crisis Unit telephone number   Suggestion that there be no alcohol in the home in the future;  or at minimum for the next 90 days.   Clide Dales 07/02/2012 12:59 PM

## 2012-07-02 NOTE — Progress Notes (Signed)
D: Pt reporting indigestion and has requested Tums. A: Pt informed of standing order of Mylanta. Pt administered Mylanta at midnight. Encouraged to speak with physician about Tums if Mylanta didn't give her the desired relief. R: Pt reports relief of indigestion at follow-up.

## 2012-07-02 NOTE — Progress Notes (Signed)
[facility] MD Progress Note  07/02/2012 3:42 PM  S/O: Patient seen and evaluated. Chart reviewed. Patient stated that her mood was "good". Her affect was mood congruent and euthymic. She denied any current thoughts of self injurious behavior, suicidal ideation or homicidal ideation. There were no auditory or visual hallucinations, paranoia, delusional thought processes, or mania noted.  Thought process was linear and goal directed.  No psychomotor agitation or retardation was noted. Hx social anxiety and potential panis attacks. Speech was normal rate, tone and volume. Eye contact was good. Judgment and insight are fair.  Patient has been up and engaged on the unit.  No acute safety concerns reported from team.     Sleep:  Number of Hours: 4    Vital Signs:Blood pressure 140/90, pulse 100, temperature 97.5 F (36.4 C), temperature source Oral, resp. rate 16, height 5\' 3"  (1.6 m), weight 64.978 kg (143 lb 4 oz), last menstrual period 06/09/2012.  Lab Results:  Results for orders placed during the hospital encounter of 06/30/12 (from the past 48 hour(s))  CBC WITH DIFFERENTIAL     Status: Abnormal   Collection Time   06/30/12  7:01 PM      Component Value Range Comment   WBC 8.0  4.0 - 10.5 K/uL    RBC 4.72  3.87 - 5.11 MIL/uL    Hemoglobin 16.2 (*) 12.0 - 15.0 g/dL    HCT 16.1  09.6 - 04.5 %    MCV 95.3  78.0 - 100.0 fL    MCH 34.3 (*) 26.0 - 34.0 pg    MCHC 36.0  30.0 - 36.0 g/dL    RDW 40.9  81.1 - 91.4 %    Platelets 271  150 - 400 K/uL    Neutrophils Relative 54  43 - 77 %    Neutro Abs 4.3  1.7 - 7.7 K/uL    Lymphocytes Relative 37  12 - 46 %    Lymphs Abs 3.0  0.7 - 4.0 K/uL    Monocytes Relative 7  3 - 12 %    Monocytes Absolute 0.6  0.1 - 1.0 K/uL    Eosinophils Relative 1  0 - 5 %    Eosinophils Absolute 0.0  0.0 - 0.7 K/uL    Basophils Relative 2 (*) 0 - 1 %    Basophils Absolute 0.1  0.0 - 0.1 K/uL   COMPREHENSIVE METABOLIC PANEL     Status: Abnormal   Collection Time   06/30/12   7:01 PM      Component Value Range Comment   Sodium 138  135 - 145 mEq/L    Potassium 3.2 (*) 3.5 - 5.1 mEq/L    Chloride 94 (*) 96 - 112 mEq/L    CO2 17 (*) 19 - 32 mEq/L    Glucose, Bld 98  70 - 99 mg/dL    BUN 11  6 - 23 mg/dL    Creatinine, Ser 7.82  0.50 - 1.10 mg/dL    Calcium 9.4  8.4 - 95.6 mg/dL    Total Protein 8.3  6.0 - 8.3 g/dL    Albumin 4.5  3.5 - 5.2 g/dL    AST 213 (*) 0 - 37 U/L    ALT 75 (*) 0 - 35 U/L    Alkaline Phosphatase 91  39 - 117 U/L    Total Bilirubin 0.6  0.3 - 1.2 mg/dL    GFR calc non Af Amer >90  >90 mL/min    GFR calc  Af Amer >90  >90 mL/min   ETHANOL     Status: Abnormal   Collection Time   06/30/12  7:01 PM      Component Value Range Comment   Alcohol, Ethyl (B) 388 (*) 0 - 11 mg/dL   URINE RAPID DRUG SCREEN (HOSP PERFORMED)     Status: Normal   Collection Time   06/30/12  7:19 PM      Component Value Range Comment   Opiates NONE DETECTED  NONE DETECTED    Cocaine NONE DETECTED  NONE DETECTED    Benzodiazepines NONE DETECTED  NONE DETECTED    Amphetamines NONE DETECTED  NONE DETECTED    Tetrahydrocannabinol NONE DETECTED  NONE DETECTED    Barbiturates NONE DETECTED  NONE DETECTED   URINALYSIS, ROUTINE W REFLEX MICROSCOPIC     Status: Abnormal   Collection Time   06/30/12  7:19 PM      Component Value Range Comment   Color, Urine AMBER (*) YELLOW BIOCHEMICALS MAY BE AFFECTED BY COLOR   APPearance CLEAR  CLEAR    Specific Gravity, Urine 1.025  1.005 - 1.030    pH 6.0  5.0 - 8.0    Glucose, UA NEGATIVE  NEGATIVE mg/dL    Hgb urine dipstick MODERATE (*) NEGATIVE    Bilirubin Urine SMALL (*) NEGATIVE    Ketones, ur 40 (*) NEGATIVE mg/dL    Protein, ur >086 (*) NEGATIVE mg/dL    Urobilinogen, UA 0.2  0.0 - 1.0 mg/dL    Nitrite NEGATIVE  NEGATIVE    Leukocytes, UA NEGATIVE  NEGATIVE   PREGNANCY, URINE     Status: Normal   Collection Time   06/30/12  7:19 PM      Component Value Range Comment   Preg Test, Ur NEGATIVE  NEGATIVE   URINE  MICROSCOPIC-ADD ON     Status: Abnormal   Collection Time   06/30/12  7:19 PM      Component Value Range Comment   Squamous Epithelial / LPF MANY (*) RARE    WBC, UA 3-6  <3 WBC/hpf    RBC / HPF 0-2  <3 RBC/hpf    Bacteria, UA FEW (*) RARE   ETHANOL     Status: Abnormal   Collection Time   06/30/12 11:16 PM      Component Value Range Comment   Alcohol, Ethyl (B) 208 (*) 0 - 11 mg/dL   ETHANOL     Status: Abnormal   Collection Time   07/01/12 12:59 AM      Component Value Range Comment   Alcohol, Ethyl (B) 157 (*) 0 - 11 mg/dL     A/P: Alcohol Dependence & W/D; Anxiety Disorder NOS; Hypokalemia  1. Initiated citalopram 20 mg gd for anxiety. 2. Pt requesting discharge, may be able to leave in am s/p lab results (repeat CMP pending) and stabilization of vitals. 3. Medication education completed.  Pros, cons, risks, potential side effects and benefits (including no treatment) were discussed with pt.  Pt agreeable with the plan.  See orders.  Discussed with team.  Lupe Carney 07/02/2012, 3:42 PM

## 2012-07-02 NOTE — Progress Notes (Signed)
[facility] Group Notes:  (Counselor/Nursing/MHT/Case Management/Adjunct)  07/01/2012 0900 Type of Therapy:  karaoke  Participation Level:  Active  Participation Quality:  Appropriate, Resistant and Supportive  Affect:  Appropriate and Irritable  Cognitive:  Alert  Insight:  Good  Engagement in Group:  Limited  Engagement in Therapy:  Limited  Modes of Intervention:  Activity and Socialization  Summary of Progress/Problems: Pt attended group only after being strongly encouraged to go.  Pt was supportive of peers but not very involved.  Pt appeared to be not enjoying herself.  Pt. Spoke to this writer about her blood pressure being a factor in keeping her in the hospital.  Pt seemed very focused on leaving, leaving by Sunday.    Shelah Lewandowsky 07/02/2012, 1:30 AM
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
## 2012-07-02 NOTE — Progress Notes (Signed)
D:  Pt about on the unit today.  She said that she slept fair last pm...woke up a few times.  Reports her appetite is good and her energy level is normal.  Denies any SI/HI or A/V hallucinations.  She says that she hopes to go home maybe tomorrow. She denies any withdrawal symptoms this am.  She says that she plans to stop drinking to better take care of herself. Blood pressure better this am.  A:  RN offered support and encouragement.  Encouraged to attend all groups.  R:  Receptive to staff.  Remains on q 15 minute checks.  Will continue to monitor.

## 2012-07-02 NOTE — Progress Notes (Signed)
Psychoeducational Group Note  Date:  07/02/2012 Time:  4098-1191  Group Topic/Focus:  Early Warning Signs:   The focus of this group is to help patients identify signs or symptoms they exhibit before slipping into an unhealthy state or crisis.  Participation Level:  Active  Participation Quality:  Appropriate and Sharing  Affect:  Appropriate  Cognitive:  Appropriate  Insight:  Good  Engagement in Group:  Good  Additional Comments:  Pt got a little teary eyed when pt was talking about her children  Dilraj Killgore, Genia Plants 07/02/2012, 1:43 PM

## 2012-07-02 NOTE — Progress Notes (Signed)
[facility] Group Notes:  (Counselor/Nursing/MHT/Case Management/Adjunct)  07/02/2012   Type of Therapy:  Group Therapy from 1:15 to 2:30  Participation Level:  Active  Participation Quality:  Appropriate, Attentive and Sharing  Affect:  Appropriate  Cognitive:  Alert, Appropriate and Oriented  Insight:  Good  Engagement in Group:  Good  Engagement in Therapy:  Good  Modes of Intervention:  Clarification, Orientation and Support  Summary of Progress/Problems:  Focus of group was processing feelings about relapse; whether patients had experienced one, none or multiple. Julia Dunlap shared that financial issues are often a trigger for her.  She also shared the dreaded feelings experienced in early morning hours waiting for others to arise so she can discern what if anything may have happened the night before during a black out. Julia Dunlap later shared her intention to travel home from work by alternate route so as to avoid [employer] and idea of saving her [employer] money for a family treat which she believes would be an inner reward.  Julia Dunlap expressed support and compassion for others in group.    Clide Dales 07/02/2012 3:15 PM

## 2012-07-03 LAB — COMPREHENSIVE METABOLIC PANEL
Albumin: 4 g/dL (ref 3.5–5.2)
BUN: 15 mg/dL (ref 6–23)
Creatinine, Ser: 0.57 mg/dL (ref 0.50–1.10)
GFR calc Af Amer: >90 mL/min (ref >90)
Total Protein: 7.6 g/dL (ref 6.0–8.3)

## 2012-07-03 MED ORDER — POTASSIUM CHLORIDE CRYS ER 20 MEQ PO TBCR
40.0000 meq | EXTENDED_RELEASE_TABLET | Freq: Two times a day (BID) | ORAL | Status: AC
  Administered 2012-07-03 – 2012-07-04 (×4): 40 meq via ORAL
  Filled 2012-07-03 (×5): qty 2

## 2012-07-03 MED ORDER — POTASSIUM CHLORIDE 20 MEQ PO PACK
40.0000 meq | PACK | Freq: Once | ORAL | Status: AC
  Administered 2012-07-03: 40 meq via ORAL
  Filled 2012-07-03: qty 2

## 2012-07-03 NOTE — Progress Notes (Signed)
Patient ID: Julia Dunlap, female   DOB: 1966-03-04, 46 y.o.   MRN: [id number]  Pt. attended and participated in aftercare planning group. Pt. verbally accepted information on suicide prevention, warning signs to look for with suicide and crisis line numbers to use. Pt. listed their current anxiety level as 1 and their current depression level as 1. Pt. shared that she would be attending AA meetings at discharge. Pt. was extremely anxious about discharge and spoke about how was supposed to be the first pt. to see the doctor and that would she would be leaving today. Pt. was not on discharge list and this is her second day here.

## 2012-07-03 NOTE — Progress Notes (Signed)
Psychoeducational Group Note  Date:  07/02/2012 Time:  2000  Group Topic/Focus:  AA  Participation Level:  Active  Participation Quality:  Appropriate  Affect:  Appropriate  Cognitive:  Alert  Insight:  Good  Engagement in Group:  Good  Additional Comments:  Pt attended and participated in AA group this evening.  [name] R 07/03/2012, 1:38 AM

## 2012-07-03 NOTE — Progress Notes (Signed)
therapist spoke to medical team and the decision was made that pt will not D/C today

## 2012-07-03 NOTE — Progress Notes (Signed)
Psychoeducational Group Note  Date: 07/03/2012 Time:  1015  Group Topic/Focus:  Identifying Needs:   The focus of this group is to help patients identify their personal needs that have been historically problematic and identify healthy behaviors to address their needs.  Participation Level:  Minimal  Participation Quality:  Drowsy  Affect:  Blunted  Cognitive:  Oriented  Insight:  Limited  Engagement in Group:  Limited  Additional Comments:  Kept falling asleep  Dione Housekeeper

## 2012-07-03 NOTE — Progress Notes (Signed)
Pt. Resting quietly no signs of distress noted.

## 2012-07-03 NOTE — Progress Notes (Signed)
D) Pt upset with the doctor today because she wanted to be discharged. States that the doctor yesterday told her that she could go home. Angry and frustrated with the whole staff when told she was not being discharged today. Sits in group and participates little. Affect is flat and mood depressed. Mother came up to visit Pt and when Pt. Saw her mother she began to cry. Denies SI and HI. A) Given support and provided with active listening. Encouraged to use her time wisely and to learn all she could while here. Provided with a 1:1. R) Pt denies SI and HI. States all she wants to do is be discharged

## 2012-07-03 NOTE — Progress Notes (Signed)
Patient ID: TYLEE YUM, female   DOB: 1966/08/09, 46 y.o.   MRN: [id number]   [facility] Group Notes:  (Counselor/Nursing/MHT/Case Management/Adjunct)  07/03/2012 1:15 PM  Type of Therapy:  Group Therapy, Dance/Movement Therapy   Participation Level:  None  Participation Quality:  Inattentive  Affect:  Angry  Cognitive:  Appropriate  Insight:  None  Engagement in Group:  None  Engagement in Therapy:  None  Modes of Intervention:  Clarification, Problem-solving, Role-play, Socialization and Support  Summary of Progress/Problems: Therapist and group members discussed self-sabotaging behaviors and things in our lives that need to change in order to achieve recovery. Group discussed how change can be scary and that we need to focus on recovery instead of focusing on our "war stories." Pt. shared that she was feeling angry today because she wants to be discharged. Pt. left shortly after the beginning of group.     Cassidi Long 07/03/2012. 3:16 PM

## 2012-07-03 NOTE — Progress Notes (Signed)
[facility] Adult Inpatient Family/Significant Other Suicide Prevention Education  Suicide Prevention Education:  Education Completed; Bonita Quin Mother [phone] has been identified by the patient as the family member/significant other with whom the patient will be residing, and identified as the person(s) who will aid the patient in the event of a mental health crisis (suicidal ideations/suicide attempt).  With written consent from the patient, the family member/significant other has been provided the following suicide prevention education, prior to the and/or following the discharge of the patient.  The suicide prevention education provided includes the following:  Suicide risk factors  Suicide prevention and interventions  National Suicide Hotline telephone number  [facility] assessment telephone number  [facility] Emergency Assistance 911  [facility] and/or Residential Mobile Crisis Unit telephone number  Request made of family/significant other to:  Remove weapons (e.g., guns, rifles, knives), all items previously/currently identified as safety concern.    Remove drugs/medications (over-the-counter, prescriptions, illicit drugs), all items previously/currently identified as a safety concern.  The family member/significant other verbalizes understanding of the suicide prevention education information provided.  The family member/significant other agrees to remove the items of safety concern listed above.  Mother stated that husband drinks and is drunk already today. She thinks guns have been removed from home but knows there is ETHOL in the home. Mother offered for pt to come and stay with her. Mother is worried about pt's job and not returning on Monday may result in her losing the job.  [facility] 07/03/2012, 10:33 AM

## 2012-07-03 NOTE — Progress Notes (Signed)
Psychoeducational Group Note  Date:  07/03/2012 Time:  0315  Group Topic/Focus:  Healthy Communication:   The focus of this group is to discuss communication, barriers to communication, as well as healthy ways to communicate with others.  Participation Level:  Did Not Attend  Additional Comments:  Pt did not attend.  Dalia Heading 07/03/2012, 7:26 PM

## 2012-07-03 NOTE — Progress Notes (Signed)
Took over Pt's care at 2330, Pt in room resting in bed, no acute distress noted.  Respirations even and unlabored.  Will continue to monitor.

## 2012-07-03 NOTE — Progress Notes (Signed)
D: Pt denies SI/HI/AVH. Pt states that earlier she was upset and anxious about not being able to leave today, however she states that since then she has calmed down and feels a lot better. Pt states that currently her depression and hopelessness would be rated as a 1. Pt is hoping for discharge tomorrow. She states that she hopes that her "potassium is normal" because she is missing her children. Pt bright and animated. No distress noted. A: Support and encouragement offered to pt. Continue Q15 min checks for safety. R: PT receptive. Pt remains safe.

## 2012-07-03 NOTE — Progress Notes (Signed)
  Julia Dunlap is a 47 y.o. female [id number] [birth year]  07/01/2012 Principal Problem:  *Alcohol dependence   Mental Status: Mood is good denies SI/HI/AVH    Subjective/Objective: Denies issue with detox. Requests detox but K is too low at 3.1. Spoke to  Dr.J psychiatrist on call and can be considered for Discharge tomorrow.     Filed Vitals:   07/03/12 0701  BP: 133/84  Pulse: 120  Temp:   Resp:     Lab Results:   BMET    Component Value Date/Time   NA 136 07/03/2012 0708   K 3.1* 07/03/2012 0708   CL 98 07/03/2012 0708   CO2 22 07/03/2012 0708   GLUCOSE 122* 07/03/2012 0708   BUN 15 07/03/2012 0708   CREATININE 0.57 07/03/2012 0708   CALCIUM 9.8 07/03/2012 0708   GFRNONAA >90 07/03/2012 0708   GFRAA >90 07/03/2012 0708    Medications:  Scheduled:     . chlordiazePOXIDE  25 mg Oral TID   Followed by  . chlordiazePOXIDE  25 mg Oral BH-qamhs   Followed by  . chlordiazePOXIDE  25 mg Oral Daily  . citalopram  20 mg Oral Daily  . lisinopril  10 mg Oral Daily  . multivitamin with minerals  1 tablet Oral Daily  . nicotine  21 mg Transdermal Q0600  . potassium chloride  40 mEq Oral Once  . potassium chloride  40 mEq Oral BID  . thiamine  100 mg Oral Daily  . DISCONTD: potassium chloride  20 mEq Oral BID     PRN Meds alum & mag hydroxide-simeth, chlordiazePOXIDE, hydrOXYzine, loperamide, magnesium hydroxide, naphazoline, ondansetron, traZODone Plan : given K and will recheck K in am.            Continue current plan of care   Julia Dunlap,MICKIE D. 07/03/2012

## 2012-07-04 LAB — BASIC METABOLIC PANEL
CO2: 25 mEq/L (ref 19–32)
Calcium: 10 mg/dL (ref 8.4–10.5)
Chloride: 99 mEq/L (ref 96–112)
Creatinine, Ser: 0.55 mg/dL (ref 0.50–1.10)
Glucose, Bld: 109 mg/dL — ABNORMAL HIGH (ref 70–99)
Sodium: 135 mEq/L (ref 135–145)

## 2012-07-04 MED ORDER — POTASSIUM CHLORIDE CRYS ER 20 MEQ PO TBCR: EXTENDED_RELEASE_TABLET | ORAL | Status: DC

## 2012-07-04 MED ORDER — LISINOPRIL 10 MG PO TABS: 10.0000 mg | ORAL_TABLET | Freq: Every day | ORAL | Status: DC

## 2012-07-04 MED ORDER — CITALOPRAM HYDROBROMIDE 20 MG PO TABS: 20.0000 mg | ORAL_TABLET | Freq: Every day | ORAL | Status: DC

## 2012-07-04 NOTE — Progress Notes (Signed)
Psychoeducational Group Note  Date:  07/04/2012 Time:  0930  Group Topic/Focus:  Spirituality:   The focus of this group is to discuss how one's spirituality can aide in recovery.  Participation Level:  None  Participation Quality:  Appropriate and Resistant  Affect:  Appropriate and Flat  Cognitive:  Appropriate  Engagement in Group:  None  Additional Comments:  Pt attended group but did not participate except for the very end of group when she read an inspirational quote aloud to the group. Pt had no feedback or discussion on this.    Dalia Heading 07/04/2012, 1:55 PM

## 2012-07-04 NOTE — Progress Notes (Signed)
Psychoeducational Group Note  Date:  07/04/2012 Time:  1515  Group Topic/Focus:  Conflict Resolution:   The focus of this group is to discuss the conflict resolution process and how it may be used upon discharge.  Participation Level:  None  Participation Quality:  Appropriate and Inattentive  Affect:  Appropriate and Flat  Cognitive:  Appropriate  Insight:  Pt did not participate'  Engagement in Group:  None  Additional Comments:  Pt attended group but did not participate.   Dalia Heading 07/04/2012, 5:02 PM

## 2012-07-04 NOTE — [facility] Suicide Risk Assessment (Signed)
Suicide Risk Assessment  Discharge Assessment     Demographic factors:  Caucasian    Current Mental Status Per Nursing Assessment::   On Admission:    At Discharge:     Current Mental Status Per Physician: Patient was anxious about discharge and has normal thought process and no suicidal or homicidal ideations. She has no evidence of psychosis. She has fair insight, judgment and impulse control.  Loss Factors: Financial problems / change in socioeconomic status  Historical Factors:    Risk Reduction Factors:      Continued Clinical Symptoms:  Alcohol/Substance Abuse/Dependencies  Discharge Diagnoses:   AXIS I:  Alcohol Dependence AXIS II:  Deferred AXIS III:  History reviewed. No pertinent past medical history. AXIS IV:  problems related to social environment, problems with access to health care services and problems with primary support group AXIS V:  61-70 mild symptoms  Cognitive Features That Contribute To Risk:  Closed-mindedness Polarized thinking    Suicide Risk:  Minimal: No identifiable suicidal ideation.  Patients presenting with no risk factors but with morbid ruminations; may be classified as minimal risk based on the severity of the depressive symptoms  Plan Of Care/Follow-up recommendations:  Activity:  Normal  Diet:  Regular  Haruka Kowaleski,JANARDHAHA R. 07/04/2012, 4:10 PM

## 2012-07-04 NOTE — Progress Notes (Signed)
[facility] Case Management Discharge Plan:  Will you be returning to the same living situation after discharge: Yes,  home with her husband At discharge, do you have transportation home?:Yes,  husband Do you have the ability to pay for your medications:Yes,    Interagency Information:     Release of information consent forms completed and in the chart;  Patient's signature needed at discharge.  Patient to Follow up at:  Follow-up Information    Follow up with Follow up with your EAP provider.      Follow up with Attend AA mtgs in [place], get a sponser.         Patient denies SI/HI:   Yes,  pt denies    Aeronautical engineer and Suicide Prevention discussed:  Yes,  with pt and with mother  Barrier to discharge identified:Yes,  Pt is waiting on blood results to be medicaly cleared  Summary and Recommendations: Pt stated that she would like to go home today or tonight or first thing in the morning for she wants to report to work in the morning. Pt can return home and husband can transport. Pt stated that she knows her husband also drinks and that she is hopeful he will go to treatment next week. Pt plans to avoid husbands drinking by working this week and being "out of the house". Pt denies any and all S/I and H/I.    Gevena Mart 07/04/2012, 2:42 PM

## 2012-07-04 NOTE — Progress Notes (Signed)
[facility] MD Progress Note  07/04/2012 4:05 PM  Diagnosis:  Axis I: Alcohol Dependence  ADL's:  Intact  Sleep: Fair  Appetite:  Good  Suicidal Ideation:  Plan:  No Intent:  NO Means:  NO Homicidal Ideation:  Plan:  No Intent:  NO Means:  NO  AEB (as evidenced by):  Mental Status Examination/Evaluation: Objective:  Appearance: Casual and Fairly Groomed  Eye Contact::  Good  Speech:  Clear and Coherent and Normal Rate  Volume:  Normal  Mood:  Anxious  Affect:  Appropriate and Congruent  Thought Process:  Coherent, Goal Directed, Intact, Linear and Logical  Orientation:  Full  Thought Content:  WDL  Suicidal Thoughts:  No  Homicidal Thoughts:  No  Memory:  Immediate;   Fair Recent;   Fair  Judgement:  Fair  Insight:  Fair  Psychomotor Activity:  Normal  Concentration:  Fair  Recall:  Good  Akathisia:  No  Handed:  Right  AIMS (if indicated):     Assets:  Communication Skills Desire for Improvement Financial Resources/Insurance Housing  Sleep:  Number of Hours: 6    Vital Signs:Blood pressure 118/86, pulse 104, temperature 96.7 F (35.9 C), temperature source Oral, resp. rate 16, height 5\' 3"  (1.6 m), weight 143 lb 4 oz (64.978 kg), last menstrual period 06/09/2012. Current Medications: Current Facility-Administered Medications  Medication Dose Route Frequency Provider Last Rate Last Dose  . alum & mag hydroxide-simeth (MAALOX/MYLANTA) 200-200-20 MG/5ML suspension 30 mL  30 mL Oral Q4H PRN Julia Guild, PA-C   30 mL at 07/02/12 0000  . chlordiazePOXIDE (LIBRIUM) capsule 25 mg  25 mg Oral Q6H PRN Julia Guild, PA-C      . chlordiazePOXIDE (LIBRIUM) capsule 25 mg  25 mg Oral BH-qamhs Julia Guild, PA-C   25 mg at 07/04/12 9147   Followed by  . chlordiazePOXIDE (LIBRIUM) capsule 25 mg  25 mg Oral Daily Julia Guild, PA-C      . citalopram (CELEXA) tablet 20 mg  20 mg Oral Daily Julia Kuroski-Mazzei, DO   20 mg at 07/04/12 0751  . hydrOXYzine (ATARAX/VISTARIL) tablet 25 mg  25 mg  Oral Q6H PRN Julia Guild, PA-C      . lisinopril (PRINIVIL,ZESTRIL) tablet 10 mg  10 mg Oral Daily Julia Kava, NP   10 mg at 07/04/12 0751  . loperamide (IMODIUM) capsule 2-4 mg  2-4 mg Oral PRN Julia Guild, PA-C      . magnesium hydroxide (MILK OF MAGNESIA) suspension 30 mL  30 mL Oral Daily PRN Julia Guild, PA-C      . multivitamin with minerals tablet 1 tablet  1 tablet Oral Daily Julia Guild, PA-C   1 tablet at 07/04/12 0751  . naphazoline (NAPHCON) 0.1 % ophthalmic solution 1 drop  1 drop Both Eyes QID PRN Julia Kava, NP      . ondansetron (ZOFRAN-ODT) disintegrating tablet 4 mg  4 mg Oral Q6H PRN Julia Guild, PA-C      . potassium chloride SA (K-DUR,KLOR-CON) CR tablet 40 mEq  40 mEq Oral BID Julia Spurr, PA-C   40 mEq at 07/04/12 0751  . thiamine (VITAMIN B-1) tablet 100 mg  100 mg Oral Daily Julia Guild, PA-C   100 mg at 07/04/12 0751  . traZODone (DESYREL) tablet 50 mg  50 mg Oral QHS PRN Julia Bacon, MD   50 mg at 07/01/12 2156  . DISCONTD: nicotine (NICODERM CQ - dosed in mg/24 hours) patch 21 mg  21 mg  Transdermal Q0600 Julia Guild, PA-C   21 mg at 07/02/12 0600    Lab Results:  Results for orders placed during the hospital encounter of 07/01/12 (from the past 48 hour(s))  COMPREHENSIVE METABOLIC PANEL     Status: Abnormal   Collection Time   07/03/12  7:08 AM      Component Value Range Comment   Sodium 136  135 - 145 mEq/L    Potassium 3.1 (*) 3.5 - 5.1 mEq/L    Chloride 98  96 - 112 mEq/L    CO2 22  19 - 32 mEq/L    Glucose, Bld 122 (*) 70 - 99 mg/dL    BUN 15  6 - 23 mg/dL    Creatinine, Ser 1.61  0.50 - 1.10 mg/dL    Calcium 9.8  8.4 - 09.6 mg/dL    Total Protein 7.6  6.0 - 8.3 g/dL    Albumin 4.0  3.5 - 5.2 g/dL    AST 045 (*) 0 - 37 U/L    ALT 139 (*) 0 - 35 U/L    Alkaline Phosphatase 99  39 - 117 U/L    Total Bilirubin 1.5 (*) 0.3 - 1.2 mg/dL    GFR calc non Af Amer >90  >90 mL/min    GFR calc Af Amer >90  >90 mL/min     Physical Findings: AIMS: Facial and Oral  Movements Muscles of Facial Expression: None, normal Lips and Perioral Area: None, normal Jaw: None, normal Tongue: None, normal,Extremity Movements Upper (arms, wrists, hands, fingers): None, normal Lower (legs, knees, ankles, toes): None, normal, Trunk Movements Neck, shoulders, hips: None, normal, Overall Severity Severity of abnormal movements (highest score from questions above): None, normal Incapacitation due to abnormal movements: None, normal Patient's awareness of abnormal movements (rate only patient's report): No Awareness, Dental Status Current problems with teeth and/or dentures?: No Does patient usually wear dentures?: No  CIWA:  CIWA-Ar Total: 0  COWS:  COWS Total Score: 0   Treatment Plan Summary: Daily contact with patient to assess and evaluate symptoms and progress in treatment Medication management  Plan: No changes in her medication treatment. Patient has been waiting to be discharged and waiting for her labso results.  Julia Dunlap,Julia R. 07/04/2012, 4:05 PM

## 2012-07-04 NOTE — Progress Notes (Signed)
Pt discharged home with family member at 2050, this staff member walked out with Pt.  Pt denies suicidal ideation (see Suicide Risk Assessment), scripts called in to pharmacy by Lamount Cranker, RN.  Discharge paperwork signed and on chart.

## 2012-07-04 NOTE — Progress Notes (Signed)
Patient ID: ANJANAE WOEHRLE, female   DOB: 1966/12/03, 46 y.o.   MRN: [id number]  [facility] Group Notes:  (Counselor/Nursing/MHT/Case Management/Adjunct)  07/04/2012 1:15 PM  Type of Therapy:  Group Therapy, Dance/Movement Therapy   Participation Level:  Minimal  Participation Quality:  Appropriate and Drowsy  Affect:  Appropriate  Cognitive:  Appropriate  Insight:  Limited  Engagement in Group:  Limited  Engagement in Therapy:  Limited  Modes of Intervention:  Clarification, Problem-solving, Role-play, Socialization and Support  Summary of Progress/Problems:Therapist discussed the role supports have in our lives and why supports are needed in the recovery process.  Therapist used the metaphor of a tool box and how various tools are needed to build a strong foundation. Therapist processed with group  types of support systems needed in order to prevent relapse. Therapist asked group what tools do you need in order to maintain your sobriety.   Pt. participated in check in; however, pt. was drowsy pt. then left group and reentered.         Rhunette Croft 07/04/2012. 4:56 PM

## 2012-07-04 NOTE — Progress Notes (Signed)
Psychoeducational Group Note  Date:  07/04/2012 Time:  1015  Group Topic/Focus:  Making Healthy Choices:   The focus of this group is to help patients identify negative/unhealthy choices they were using prior to admission and identify positive/healthier coping strategies to replace them upon discharge.  Participation Level:  Active  Participation Quality:  Appropriate  Affect:  Appropriate  Cognitive:  Alert  Insight:  Good  Engagement in Group:  Good  Additional Comments:  Had to leave the group to meet with the doctor.  Dione Housekeeper 07/04/2012

## 2012-07-04 NOTE — Progress Notes (Signed)
Patient ID: Julia Dunlap, female   DOB: 1966/12/02, 46 y.o.   MRN: [id number] Pt. attended and participated in aftercare planning group. Pt. verbally accepted information on suicide prevention, warning signs to look for with suicide and crisis line numbers to use. The pt. agreed to call crisis line numbers if having warning signs or having thoughts of suicide. Pt. listed their current anxiety and depression levels as low.

## 2012-07-04 NOTE — Progress Notes (Signed)
Pt is hopeful of D/C once her lab results come in. Dr signed D/C assessment. Pt will return home via husband. Pt has job to go to Monday AM.

## 2012-07-04 NOTE — Progress Notes (Signed)
[facility] Group Notes:  (Counselor/Nursing/MHT/Case Management/Adjunct)  07/03/12 0900 Type of Therapy:  wrap up group  Participation Level:  Active  Participation Quality:  Appropriate, Attentive, Sharing and Supportive  Affect:  Appropriate and Irritable  Cognitive:  Alert and Appropriate  Insight:  Good  Engagement in Group:  Good  Engagement in Therapy:  Limited  Modes of Intervention:  Clarification, Education and Support  Summary of Progress/Problems: Pt still concentrating on leaving as soon as possible to get home to husband and three kids.  Pt stated she has a very good support system. When asked if anyone in of her family members she listed as support also has a drinking problem she stated " yes, my husband and you will be seeing him shortly whether he likes it or not."  Pt mentioned that her husband is wanting the help too.  Pt states " I want a new life".  When asked what the new life would in tell she stated "no alcohol".   Shelah Lewandowsky 07/04/2012, 2:13 AM

## 2012-07-04 NOTE — Progress Notes (Signed)
D) Pt upset that her blood was not drawn this morning so that she could be discharged. Is able to tolerate the lab coming this evening to draw her blood. Denies SI and HI. States that she is ready to go home. A) Given support and reassurance. Given praise for her ability to wait for lab to come this evening. R) Denies SI and HI.

## 2012-07-05 NOTE — Progress Notes (Signed)
Patient ID: Julia Dunlap, female   DOB: 1966/05/30, 47 y.o.   MRN: [id number]   Patient's Medication was called in by me personally at [phone] to [facility].  Celexa 20mg  1 po qd for anxiety and depression. #30/0 rf Lisinopril 10mg  tablet  1 po qAM for hypertension. #30/0 rf Potassium Chloride SA 20 MEQ 1 po qd for low potassium #30/0 rf.  Rona Ravens. Victoriya Pol [facility] 07/05/2012

## 2012-07-06 NOTE — Progress Notes (Signed)
Patient Discharge Instructions: Patient refused to give consent for follow up. Rhoda, Waldvogel, 07/06/2012, 4:34 PM
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
## 2013-06-08 ENCOUNTER — Other Ambulatory Visit: Payer: Self-pay | Admitting: Dermatology

## 2013-10-13 ENCOUNTER — Other Ambulatory Visit: Payer: Self-pay | Admitting: Physician Assistant

## 2013-10-31 ENCOUNTER — Encounter: Payer: Self-pay | Admitting: Podiatry

## 2013-10-31 ENCOUNTER — Ambulatory Visit (INDEPENDENT_AMBULATORY_CARE_PROVIDER_SITE_OTHER): Payer: BC Managed Care – PPO | Admitting: Podiatry

## 2013-10-31 VITALS — BP 125/72 | HR 93 | Resp 16 | Ht 63.0 in | Wt 155.0 lb

## 2013-10-31 DIAGNOSIS — L600 Ingrowing nail: Secondary | ICD-10-CM

## 2013-10-31 DIAGNOSIS — B079 Viral wart, unspecified: Secondary | ICD-10-CM

## 2013-10-31 NOTE — Patient Instructions (Signed)

## 2013-10-31 NOTE — Progress Notes (Signed)
  Subjective:    Patient ID: Julia Dunlap, female    DOB: 05-28-1966, 47 y.o.   MRN: [id number]  HPI Comments: N - aches L - 5th toe right D - few mos. O - gradual C - lesion on toe near toenail, dermatologist removed lesion, thought maybe a wart, never got any       better and doc recommended toenail removal A - pressure T - no treatment, other than lesion removal      Review of Systems  All other systems reviewed and are negative.       Objective:   Physical Exam        Assessment & Plan:

## 2013-11-02 NOTE — Progress Notes (Signed)
Subjective:     Patient ID: Julia Dunlap, female   DOB: [birth year], 47 y.o.   MRN: [id number]  HPI patient presents on referral from dermatologist who had removed a small wart at the distal end of the right fifth toe and she states it is still bothering her and also involving the nailbed   Review of Systems  All other systems reviewed and are negative.       Objective:   Physical Exam  Constitutional: She is oriented to person, place, and time.  Cardiovascular: Intact distal pulses.   Musculoskeletal: Normal range of motion.  Neurological: She is oriented to person, place, and time.  Skin: Skin is warm.   Patient's neurovascular status intact muscle strength is adequate and no equinus condition is noted. Distal end right fifth toe there is a small nodule with a healing area from previous procedure of several weeks ago and what appears to be tissue underneath the distal nail bed    Assessment:     Probable verruca invading the distal nailbed fifth toe right foot    Plan:     H&P reviewed and explained with patient I anesthetized the toe 60 mg Xylocaine Marcaine mixture remove the fifth nail and removed a small mass from the distal portion of the toe which I placed in formalin and sent for pathology. Applied sterile dressing and will allow nail to regrow explaining it may not be grow normally. Recheck patient in several weeks and begin soaks of the area

## 2013-12-05 ENCOUNTER — Ambulatory Visit (INDEPENDENT_AMBULATORY_CARE_PROVIDER_SITE_OTHER): Payer: BC Managed Care – PPO | Admitting: Podiatry

## 2013-12-05 ENCOUNTER — Encounter: Payer: Self-pay | Admitting: Podiatry

## 2013-12-05 ENCOUNTER — Ambulatory Visit (INDEPENDENT_AMBULATORY_CARE_PROVIDER_SITE_OTHER): Payer: BC Managed Care – PPO

## 2013-12-05 VITALS — BP 142/86 | HR 102 | Resp 12

## 2013-12-05 DIAGNOSIS — R52 Pain, unspecified: Secondary | ICD-10-CM

## 2013-12-05 DIAGNOSIS — R609 Edema, unspecified: Secondary | ICD-10-CM

## 2013-12-05 DIAGNOSIS — M8448XA Pathological fracture, other site, initial encounter for fracture: Secondary | ICD-10-CM

## 2013-12-05 NOTE — Progress Notes (Signed)
Subjective:     Patient ID: Julia Dunlap, female   DOB: 1966/04/14, 47 y.o.   MRN: [id number]  HPI patient states my right foot on top is painful and swollen and I think maybe I broke something. States it started 2 days ago   Review of Systems     Objective:   Physical Exam Neurovascular status intact with no health history changes noted. Edema dorsal right foot around the second metatarsal shaft with pain when I pressed into the area    Assessment:     Probable stress fracture second metatarsal versus capsulitis or tendinitis    Plan:     H&P performed x-ray reviewed and today air fracture walker dispensed with instructions on usage. Advised on ice therapy and reduced activity for the next couple weeks and reappoint 3 weeks to repeat x-ray the foot

## 2013-12-05 NOTE — Progress Notes (Signed)
   Subjective:    Patient ID: Julia Dunlap, female    DOB: 1966/05/04, 47 y.o.   MRN: [id number]  Foot Pain The current episode started in the past 7 days. The problem occurs constantly. The problem has been unchanged. Associated symptoms include weakness. The symptoms are aggravated by walking and standing. She has tried NSAIDs for the symptoms. The treatment provided no relief.      Review of Systems  Neurological: Positive for weakness.  All other systems reviewed and are negative.       Objective:   Physical Exam        Assessment & Plan:

## 2013-12-26 ENCOUNTER — Ambulatory Visit: Payer: BC Managed Care – PPO | Admitting: Podiatry

## 2013-12-29 ENCOUNTER — Ambulatory Visit: Payer: BC Managed Care – PPO | Admitting: Podiatry

## 2014-01-19 ENCOUNTER — Encounter: Payer: Self-pay | Admitting: Podiatry

## 2014-12-21 ENCOUNTER — Ambulatory Visit: Payer: BC Managed Care – PPO | Admitting: Gynecology

## 2015-12-04 ENCOUNTER — Ambulatory Visit (INDEPENDENT_AMBULATORY_CARE_PROVIDER_SITE_OTHER): Payer: BLUE CROSS/BLUE SHIELD | Admitting: Family Medicine

## 2015-12-04 ENCOUNTER — Encounter: Payer: Self-pay | Admitting: Family Medicine

## 2015-12-04 ENCOUNTER — Ambulatory Visit (INDEPENDENT_AMBULATORY_CARE_PROVIDER_SITE_OTHER): Payer: BLUE CROSS/BLUE SHIELD

## 2015-12-04 VITALS — BP 128/84 | HR 101 | Temp 98.0°F | Ht 63.0 in | Wt 134.6 lb

## 2015-12-04 DIAGNOSIS — F1021 Alcohol dependence, in remission: Secondary | ICD-10-CM | POA: Diagnosis not present

## 2015-12-04 DIAGNOSIS — M546 Pain in thoracic spine: Secondary | ICD-10-CM

## 2015-12-04 MED ORDER — PREDNISONE 20 MG PO TABS: 40.0000 mg | ORAL_TABLET | Freq: Every day | ORAL | Status: DC

## 2015-12-04 MED ORDER — AZITHROMYCIN 250 MG PO TABS: ORAL_TABLET | ORAL | Status: DC

## 2015-12-04 MED ORDER — CYCLOBENZAPRINE HCL 5 MG PO TABS: 5.0000 mg | ORAL_TABLET | Freq: Three times a day (TID) | ORAL | Status: DC | PRN

## 2015-12-04 NOTE — Progress Notes (Signed)
   HPI  Patient presents today here is back pain.  Patient explains she has bilateral middle/thoracic back pain for 2 weeks. Is described as a dull aching pain it's worse with deep inspiration, lifting her arms above her head, or lying down at night. She has no cough, fever, malaise orthopnea. She has no inciting injury or inciting event.  She states that she's never had pain quite like this before. She's recovering alcoholic, prefers to avoid narcotics.  No dysuria, hematuria, orradiation down to the groin  PMH: Smoking status noted ROS: Per HPI  Objective: BP 128/84 mmHg  Pulse 101  Temp(Src) 98 F (36.7 C) (Oral)  Ht 5\' 3"  (1.6 m)  Wt 134 lb 9.6 oz (61.054 kg)  BMI 23.85 kg/m2 Gen: NAD, alert, cooperative with exam HEENT: NCAT CV: RRR, good S1/S2, no murmur Resp: mostly clear,  non-labored , Soft crackles at the L base Abd: SNTND, BS present, no guarding or organomegaly-  Musculoskeletal: No tenderness to palpation of the thoracic spine or paraspinal muscles  Chest x-ray with flattening of the left hemidiaphragm, also possible left-sided pleural effusion  Assessment and plan:  # Back pain, pleurisy Unclear etiology, however her history is consistent with pleurisy as well as muscular pain such as muscle spasm. Chest x-ray with flattened left hemidiaphragm and possible pleural effusion Overhead and treat her with azithromycin  Her that she's not having atypical pneumonia Prednisone course  Flexeril for possible muscle spasms and difficulty sleeping Repeat chest x-ray in one month to ensure resolution, she is a smoker    Orders Placed This Encounter  Procedures  . DG Chest 2 View    Standing Status: Future     Number of Occurrences:      Standing Expiration Date: 02/03/2017    Order Specific Question:  Reason for Exam (SYMPTOM  OR DIAGNOSIS REQUIRED)    Answer:  back pain ? pleurisy    Order Specific Question:  Is the patient pregnant?    Answer:  No    Order  Specific Question:  Preferred imaging location?    Answer:  Internal     Laroy Apple, MD [facility] 12/04/2015, 4:26 PM

## 2015-12-04 NOTE — Patient Instructions (Addendum)
Great to meet you!  Lets see how the prednisone helps  I am concerned there is a little fluid in your lung which why I am treating you for infection, please come back in 1 month to repeat the x ray  Flexeril can make you sleepy, take it first at night  Please come back if you do not get better as expected.   Back Pain, Adult Back pain is very common in adults.The cause of back pain is rarely dangerous and the pain often gets better over time.The cause of your back pain may not be known. Some common causes of back pain include:  Strain of the muscles or ligaments supporting the spine.  Wear and tear (degeneration) of the spinal disks.  Arthritis.  Direct injury to the back. For many people, back pain may return. Since back pain is rarely dangerous, most people can learn to manage this condition on their own. HOME CARE INSTRUCTIONS Watch your back pain for any changes. The following actions may help to lessen any discomfort you are feeling:  Remain active. It is stressful on your back to sit or stand in one place for long periods of time. Do not sit, drive, or stand in one place for more than 30 minutes at a time. Take short walks on even surfaces as soon as you are able.Try to increase the length of time you walk each day.  Exercise regularly as directed by your health care provider. Exercise helps your back heal faster. It also helps avoid future injury by keeping your muscles strong and flexible.  Do not stay in bed.Resting more than 1-2 days can delay your recovery.  Pay attention to your body when you bend and lift. The most comfortable positions are those that put less stress on your recovering back. Always use proper lifting techniques, including:  Bending your knees.  Keeping the load close to your body.  Avoiding twisting.  Find a comfortable position to sleep. Use a firm mattress and lie on your side with your knees slightly bent. If you lie on your back, put a  pillow under your knees.  Avoid feeling anxious or stressed.Stress increases muscle tension and can worsen back pain.It is important to recognize when you are anxious or stressed and learn ways to manage it, such as with exercise.  Take medicines only as directed by your health care provider. Over-the-counter medicines to reduce pain and inflammation are often the most helpful.Your health care provider may prescribe muscle relaxant drugs.These medicines help dull your pain so you can more quickly return to your normal activities and healthy exercise.  Apply ice to the injured area:  Put ice in a plastic bag.  Place a towel between your skin and the bag.  Leave the ice on for 20 minutes, 2-3 times a day for the first 2-3 days. After that, ice and heat may be alternated to reduce pain and spasms.  Maintain a healthy weight. Excess weight puts extra stress on your back and makes it difficult to maintain good posture. SEEK MEDICAL CARE IF:  You have pain that is not relieved with rest or medicine.  You have increasing pain going down into the legs or buttocks.  You have pain that does not improve in one week.  You have night pain.  You lose weight.  You have a fever or chills. SEEK IMMEDIATE MEDICAL CARE IF:   You develop new bowel or bladder control problems.  You have unusual weakness or numbness in  your arms or legs.  You develop nausea or vomiting.  You develop abdominal pain.  You feel faint.   This information is not intended to replace advice given to you by your health care provider. Make sure you discuss any questions you have with your health care provider.   Document Released: 12/08/2005 Document Revised: 12/29/2014 Document Reviewed: 04/11/2014 Elsevier Interactive Patient Education [employer].

## 2015-12-10 ENCOUNTER — Telehealth: Payer: Self-pay | Admitting: Family Medicine

## 2015-12-10 MED ORDER — DICLOFENAC SODIUM 75 MG PO TBEC: 75.0000 mg | DELAYED_RELEASE_TABLET | Freq: Two times a day (BID) | ORAL | Status: DC

## 2015-12-10 NOTE — Telephone Encounter (Signed)
Called, discussed x ray results. Thoracic spine normal appearing.   Called in voltaren to take BID for 1 week.   Follow up if not improved, consider CT chest/thoracic spine  Laroy Apple, MD [facility] 12/10/2015, 10:35 AM

## 2016-08-06 ENCOUNTER — Other Ambulatory Visit: Payer: Self-pay | Admitting: Women's Health

## 2016-08-06 DIAGNOSIS — Z1231 Encounter for screening mammogram for malignant neoplasm of breast: Secondary | ICD-10-CM

## 2016-09-05 ENCOUNTER — Ambulatory Visit (INDEPENDENT_AMBULATORY_CARE_PROVIDER_SITE_OTHER): Payer: BLUE CROSS/BLUE SHIELD | Admitting: Women's Health

## 2016-09-05 ENCOUNTER — Encounter: Payer: Self-pay | Admitting: Women's Health

## 2016-09-05 ENCOUNTER — Ambulatory Visit
Admission: RE | Admit: 2016-09-05 | Discharge: 2016-09-05 | Disposition: A | Discharge status: Home / Self Care | Payer: BLUE CROSS/BLUE SHIELD | Source: Ambulatory Visit | Attending: Women's Health | Admitting: Women's Health

## 2016-09-05 VITALS — BP 138/80 | Ht 63.0 in | Wt 131.0 lb

## 2016-09-05 DIAGNOSIS — Z01419 Encounter for gynecological examination (general) (routine) without abnormal findings: Secondary | ICD-10-CM | POA: Diagnosis not present

## 2016-09-05 DIAGNOSIS — Z13220 Encounter for screening for lipoid disorders: Secondary | ICD-10-CM

## 2016-09-05 DIAGNOSIS — Z1231 Encounter for screening mammogram for malignant neoplasm of breast: Secondary | ICD-10-CM

## 2016-09-05 LAB — LIPID PANEL
CHOL/HDL RATIO: 3.6 ratio (ref <=5.0)
Cholesterol: 167 mg/dL (ref 125–200)
HDL: 47 mg/dL (ref >=46)
LDL CALC: 106 mg/dL (ref <130)
Triglycerides: 68 mg/dL (ref <150)
VLDL: 14 mg/dL (ref <30)

## 2016-09-05 LAB — COMPREHENSIVE METABOLIC PANEL
ALT: 12 U/L (ref 6–29)
AST: 16 U/L (ref 10–35)
Albumin: 4.6 g/dL (ref 3.6–5.1)
Alkaline Phosphatase: 53 U/L (ref 33–130)
BILIRUBIN TOTAL: 0.4 mg/dL (ref 0.2–1.2)
BUN: 9 mg/dL (ref 7–25)
CALCIUM: 10 mg/dL (ref 8.6–10.4)
CHLORIDE: 106 mmol/L (ref 98–110)
CO2: 28 mmol/L (ref 20–31)
Creat: 0.69 mg/dL (ref 0.50–1.05)
GLUCOSE: 96 mg/dL (ref 65–99)
Potassium: 4.2 mmol/L (ref 3.5–5.3)
Sodium: 141 mmol/L (ref 135–146)
TOTAL PROTEIN: 6.8 g/dL (ref 6.1–8.1)

## 2016-09-05 LAB — CBC WITH DIFFERENTIAL/PLATELET
BASOS PCT: 0 %
Basophils Absolute: 0 cells/uL (ref 0–200)
EOS ABS: 165 {cells}/uL (ref 15–500)
Eosinophils Relative: 1 %
HEMATOCRIT: 43.5 % (ref 35.0–45.0)
HEMOGLOBIN: 15.2 g/dL (ref 11.7–15.5)
LYMPHS ABS: 2475 {cells}/uL (ref 850–3900)
LYMPHS PCT: 15 %
MCH: 30.3 pg (ref 27.0–33.0)
MCHC: 34.9 g/dL (ref 32.0–36.0)
MCV: 86.8 fL (ref 80.0–100.0)
MONO ABS: 660 {cells}/uL (ref 200–950)
MPV: 8.9 fL (ref 7.5–12.5)
Monocytes Relative: 4 %
NEUTROS ABS: 13200 {cells}/uL — AB (ref 1500–7800)
Neutrophils Relative %: 80 %
Platelets: 349 10*3/uL (ref 140–400)
RBC: 5.01 MIL/uL (ref 3.80–5.10)
RDW: 13.2 % (ref 11.0–15.0)
WBC: 16.5 10*3/uL — AB (ref 3.8–10.8)
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
## 2016-09-05 NOTE — Addendum Note (Signed)
Addended by: Burnett Kanaris on: 09/05/2016 10:41 AM   Modules accepted: Orders

## 2016-09-05 NOTE — Patient Instructions (Signed)
Health Maintenance, Female Adopting a healthy lifestyle and getting preventive care can go a long way to promote health and wellness. Talk with your health care provider about what schedule of regular examinations is right for you. This is a good chance for you to check in with your provider about disease prevention and staying healthy. In between checkups, there are plenty of things you can do on your own. Experts have done a lot of research about which lifestyle changes and preventive measures are most likely to keep you healthy. Ask your health care provider for more information. WEIGHT AND DIET  Eat a healthy diet  Be sure to include plenty of vegetables, fruits, low-fat dairy products, and lean protein.  Do not eat a lot of foods high in solid fats, added sugars, or salt.  Get regular exercise. This is one of the most important things you can do for your health.  Most adults should exercise for at least 150 minutes each week. The exercise should increase your heart rate and make you sweat (moderate-intensity exercise).  Most adults should also do strengthening exercises at least twice a week. This is in addition to the moderate-intensity exercise.  Maintain a healthy weight  Body mass index (BMI) is a measurement that can be used to identify possible weight problems. It estimates body fat based on height and weight. Your health care provider can help determine your BMI and help you achieve or maintain a healthy weight.  For females 20 years of age and older:   A BMI below 18.5 is considered underweight.  A BMI of 18.5 to 24.9 is normal.  A BMI of 25 to 29.9 is considered overweight.  A BMI of 30 and above is considered obese.  Watch levels of cholesterol and blood lipids  You should start having your blood tested for lipids and cholesterol at 50 years of age, then have this test every 5 years.  You may need to have your cholesterol levels checked more often if:  Your lipid  or cholesterol levels are high.  You are older than 50 years of age.  You are at high risk for heart disease.  CANCER SCREENING   Lung Cancer  Lung cancer screening is recommended for adults 55-80 years old who are at high risk for lung cancer because of a history of smoking.  A yearly low-dose CT scan of the lungs is recommended for people who:  Currently smoke.  Have quit within the past 15 years.  Have at least a 30-pack-year history of smoking. A pack year is smoking an average of one pack of cigarettes a day for 1 year.  Yearly screening should continue until it has been 15 years since you quit.  Yearly screening should stop if you develop a health problem that would prevent you from having lung cancer treatment.  Breast Cancer  Practice breast self-awareness. This means understanding how your breasts normally appear and feel.  It also means doing regular breast self-exams. Let your health care provider know about any changes, no matter how small.  If you are in your 20s or 30s, you should have a clinical breast exam (CBE) by a health care provider every 1-3 years as part of a regular health exam.  If you are 40 or older, have a CBE every year. Also consider having a breast X-ray (mammogram) every year.  If you have a family history of breast cancer, talk to your health care provider about genetic screening.  If you   are at high risk for breast cancer, talk to your health care provider about having an MRI and a mammogram every year.  Breast cancer gene (BRCA) assessment is recommended for women who have family members with BRCA-related cancers. BRCA-related cancers include:  Breast.  Ovarian.  Tubal.  Peritoneal cancers.  Results of the assessment will determine the need for genetic counseling and BRCA1 and BRCA2 testing. Cervical Cancer Your health care provider may recommend that you be screened regularly for cancer of the pelvic organs (ovaries, uterus, and  vagina). This screening involves a pelvic examination, including checking for microscopic changes to the surface of your cervix (Pap test). You may be encouraged to have this screening done every 3 years, beginning at age 21.  For women ages 30-65, health care providers may recommend pelvic exams and Pap testing every 3 years, or they may recommend the Pap and pelvic exam, combined with testing for human papilloma virus (HPV), every 5 years. Some types of HPV increase your risk of cervical cancer. Testing for HPV may also be done on women of any age with unclear Pap test results.  Other health care providers may not recommend any screening for nonpregnant women who are considered low risk for pelvic cancer and who do not have symptoms. Ask your health care provider if a screening pelvic exam is right for you.  If you have had past treatment for cervical cancer or a condition that could lead to cancer, you need Pap tests and screening for cancer for at least 20 years after your treatment. If Pap tests have been discontinued, your risk factors (such as having a new sexual partner) need to be reassessed to determine if screening should resume. Some women have medical problems that increase the chance of getting cervical cancer. In these cases, your health care provider may recommend more frequent screening and Pap tests. Colorectal Cancer  This type of cancer can be detected and often prevented.  Routine colorectal cancer screening usually begins at 50 years of age and continues through 50 years of age.  Your health care provider may recommend screening at an earlier age if you have risk factors for colon cancer.  Your health care provider may also recommend using home test kits to check for hidden blood in the stool.  A small camera at the end of a tube can be used to examine your colon directly (sigmoidoscopy or colonoscopy). This is done to check for the earliest forms of colorectal  cancer.  Routine screening usually begins at age 50.  Direct examination of the colon should be repeated every 5-10 years through 50 years of age. However, you may need to be screened more often if early forms of precancerous polyps or small growths are found. Skin Cancer  Check your skin from head to toe regularly.  Tell your health care provider about any new moles or changes in moles, especially if there is a change in a mole's shape or color.  Also tell your health care provider if you have a mole that is larger than the size of a pencil eraser.  Always use sunscreen. Apply sunscreen liberally and repeatedly throughout the day.  Protect yourself by wearing long sleeves, pants, a wide-brimmed hat, and sunglasses whenever you are outside. HEART DISEASE, DIABETES, AND HIGH BLOOD PRESSURE   High blood pressure causes heart disease and increases the risk of stroke. High blood pressure is more likely to develop in:  People who have blood pressure in the high end   of the normal range (130-139/85-89 mm Hg).  People who are overweight or obese.  People who are African American.  If you are 38-23 years of age, have your blood pressure checked every 3-5 years. If you are 61 years of age or older, have your blood pressure checked every year. You should have your blood pressure measured twice--once when you are at a hospital or clinic, and once when you are not at a hospital or clinic. Record the average of the two measurements. To check your blood pressure when you are not at a hospital or clinic, you can use:  An automated blood pressure machine at a pharmacy.  A home blood pressure monitor.  If you are between 45 years and 39 years old, ask your health care provider if you should take aspirin to prevent strokes.  Have regular diabetes screenings. This involves taking a blood sample to check your fasting blood sugar level.  If you are at a normal weight and have a low risk for diabetes,  have this test once every three years after 50 years of age.  If you are overweight and have a high risk for diabetes, consider being tested at a younger age or more often. PREVENTING INFECTION  Hepatitis B  If you have a higher risk for hepatitis B, you should be screened for this virus. You are considered at high risk for hepatitis B if:  You were born in a country where hepatitis B is common. Ask your health care provider which countries are considered high risk.  Your parents were born in a high-risk country, and you have not been immunized against hepatitis B (hepatitis B vaccine).  You have HIV or AIDS.  You use needles to inject street drugs.  You live with someone who has hepatitis B.  You have had sex with someone who has hepatitis B.  You get hemodialysis treatment.  You take certain medicines for conditions, including cancer, organ transplantation, and autoimmune conditions. Hepatitis C  Blood testing is recommended for:  Everyone born from [birth year] through 1965.  Anyone with known risk factors for hepatitis C. Sexually transmitted infections (STIs)  You should be screened for sexually transmitted infections (STIs) including gonorrhea and chlamydia if:  You are sexually active and are younger than 50 years of age.  You are older than 50 years of age and your health care provider tells you that you are at risk for this type of infection.  Your sexual activity has changed since you were last screened and you are at an increased risk for chlamydia or gonorrhea. Ask your health care provider if you are at risk.  If you do not have HIV, but are at risk, it may be recommended that you take a prescription medicine daily to prevent HIV infection. This is called pre-exposure prophylaxis (PrEP). You are considered at risk if:  You are sexually active and do not regularly use condoms or know the HIV status of your partner(s).  You take drugs by injection.  You are sexually  active with a partner who has HIV. Talk with your health care provider about whether you are at high risk of being infected with HIV. If you choose to begin PrEP, you should first be tested for HIV. You should then be tested every 3 months for as long as you are taking PrEP.  PREGNANCY   If you are premenopausal and you may become pregnant, ask your health care provider about preconception counseling.  If you may  become pregnant, take 400 to 800 micrograms (mcg) of folic acid every day.  If you want to prevent pregnancy, talk to your health care provider about birth control (contraception). OSTEOPOROSIS AND MENOPAUSE   Osteoporosis is a disease in which the bones lose minerals and strength with aging. This can result in serious bone fractures. Your risk for osteoporosis can be identified using a bone density scan.  If you are 61 years of age or older, or if you are at risk for osteoporosis and fractures, ask your health care provider if you should be screened.  Ask your health care provider whether you should take a calcium or vitamin D supplement to lower your risk for osteoporosis.  Menopause may have certain physical symptoms and risks.  Hormone replacement therapy may reduce some of these symptoms and risks. Talk to your health care provider about whether hormone replacement therapy is right for you.  HOME CARE INSTRUCTIONS   Schedule regular health, dental, and eye exams.  Stay current with your immunizations.   Do not use any tobacco products including cigarettes, chewing tobacco, or electronic cigarettes.  If you are pregnant, do not drink alcohol.  If you are breastfeeding, limit how much and how often you drink alcohol.  Limit alcohol intake to no more than 1 drink per day for nonpregnant women. One drink equals 12 ounces of beer, 5 ounces of wine, or 1 ounces of hard liquor.  Do not use street drugs.  Do not share needles.  Ask your health care provider for help if  you need support or information about quitting drugs.  Tell your health care provider if you often feel depressed.  Tell your health care provider if you have ever been abused or do not feel safe at home.   This information is not intended to replace advice given to you by your health care provider. Make sure you discuss any questions you have with your health care provider.   Document Released: 06/23/2011 Document Revised: 12/29/2014 Document Reviewed: 11/09/2013 Elsevier Interactive Patient Education [employer].

## 2016-09-06 LAB — URINALYSIS W MICROSCOPIC + REFLEX CULTURE
BACTERIA UA: NONE SEEN [HPF]
BILIRUBIN URINE: NEGATIVE
Casts: NONE SEEN [LPF]
Crystals: NONE SEEN [HPF]
GLUCOSE, UA: NEGATIVE
Hgb urine dipstick: NEGATIVE
KETONES UR: NEGATIVE
LEUKOCYTES UA: NEGATIVE
Nitrite: NEGATIVE
Protein, ur: NEGATIVE
RBC / HPF: NONE SEEN RBC/HPF (ref <=2)
SQUAMOUS EPITHELIAL / LPF: NONE SEEN [HPF] (ref <=5)
Specific Gravity, Urine: 1.015 (ref 1.001–1.035)
WBC UA: NONE SEEN WBC/HPF (ref <=5)
Yeast: NONE SEEN [HPF]
pH: 6 (ref 5.0–8.0)

## 2016-09-06 LAB — HEPATITIS C ANTIBODY: HCV Ab: NEGATIVE

## 2016-09-06 LAB — VITAMIN D 25 HYDROXY (VIT D DEFICIENCY, FRACTURES): VIT D 25 HYDROXY: 41 ng/mL (ref 30–100)

## 2016-09-08 ENCOUNTER — Other Ambulatory Visit: Payer: Self-pay | Admitting: Women's Health

## 2016-09-08 DIAGNOSIS — D72829 Elevated white blood cell count, unspecified: Secondary | ICD-10-CM

## 2016-09-09 LAB — PAP, TP IMAGING W/ HPV RNA, RFLX HPV TYPE 16,18/45: HPV mRNA, High Risk: NOT DETECTED

## 2016-10-10 ENCOUNTER — Other Ambulatory Visit: Payer: BLUE CROSS/BLUE SHIELD

## 2016-10-10 DIAGNOSIS — D72829 Elevated white blood cell count, unspecified: Secondary | ICD-10-CM

## 2016-10-10 LAB — CBC WITH DIFFERENTIAL/PLATELET
BASOS PCT: 0 %
Basophils Absolute: 0 cells/uL (ref 0–200)
EOS ABS: 145 {cells}/uL (ref 15–500)
Eosinophils Relative: 1 %
HEMATOCRIT: 42.1 % (ref 35.0–45.0)
HEMOGLOBIN: 14.4 g/dL (ref 11.7–15.5)
LYMPHS ABS: 3770 {cells}/uL (ref 850–3900)
LYMPHS PCT: 26 %
MCH: 29.8 pg (ref 27.0–33.0)
MCHC: 34.2 g/dL (ref 32.0–36.0)
MCV: 87.2 fL (ref 80.0–100.0)
MONO ABS: 580 {cells}/uL (ref 200–950)
MPV: 9.3 fL (ref 7.5–12.5)
Monocytes Relative: 4 %
NEUTROS PCT: 69 %
Neutro Abs: 10005 cells/uL — ABNORMAL HIGH (ref 1500–7800)
Platelets: 342 10*3/uL (ref 140–400)
RBC: 4.83 MIL/uL (ref 3.80–5.10)
RDW: 13.5 % (ref 11.0–15.0)
WBC: 14.5 10*3/uL — ABNORMAL HIGH (ref 3.8–10.8)

## 2017-01-14 DIAGNOSIS — E786 Lipoprotein deficiency: Secondary | ICD-10-CM | POA: Diagnosis not present

## 2017-01-14 DIAGNOSIS — Z716 Tobacco abuse counseling: Secondary | ICD-10-CM | POA: Diagnosis not present

## 2017-01-14 DIAGNOSIS — F17210 Nicotine dependence, cigarettes, uncomplicated: Secondary | ICD-10-CM | POA: Diagnosis not present

## 2017-01-14 DIAGNOSIS — Z008 Encounter for other general examination: Secondary | ICD-10-CM | POA: Diagnosis not present

## 2017-02-18 DIAGNOSIS — Z716 Tobacco abuse counseling: Secondary | ICD-10-CM | POA: Diagnosis not present

## 2017-02-18 DIAGNOSIS — F17210 Nicotine dependence, cigarettes, uncomplicated: Secondary | ICD-10-CM | POA: Diagnosis not present

## 2017-03-02 DIAGNOSIS — Z716 Tobacco abuse counseling: Secondary | ICD-10-CM | POA: Diagnosis not present

## 2017-03-02 DIAGNOSIS — F17210 Nicotine dependence, cigarettes, uncomplicated: Secondary | ICD-10-CM | POA: Diagnosis not present

## 2017-03-11 DIAGNOSIS — Z716 Tobacco abuse counseling: Secondary | ICD-10-CM | POA: Diagnosis not present

## 2017-03-11 DIAGNOSIS — F17210 Nicotine dependence, cigarettes, uncomplicated: Secondary | ICD-10-CM | POA: Diagnosis not present

## 2017-04-16 DIAGNOSIS — L728 Other follicular cysts of the skin and subcutaneous tissue: Secondary | ICD-10-CM | POA: Diagnosis not present

## 2017-04-16 DIAGNOSIS — D229 Melanocytic nevi, unspecified: Secondary | ICD-10-CM | POA: Diagnosis not present

## 2017-04-16 DIAGNOSIS — B079 Viral wart, unspecified: Secondary | ICD-10-CM | POA: Diagnosis not present

## 2017-05-06 ENCOUNTER — Encounter: Payer: Self-pay | Admitting: Gynecology

## 2017-07-20 DIAGNOSIS — E786 Lipoprotein deficiency: Secondary | ICD-10-CM | POA: Diagnosis not present

## 2017-07-20 DIAGNOSIS — F17210 Nicotine dependence, cigarettes, uncomplicated: Secondary | ICD-10-CM | POA: Diagnosis not present

## 2017-07-20 DIAGNOSIS — Z008 Encounter for other general examination: Secondary | ICD-10-CM | POA: Diagnosis not present

## 2017-07-20 DIAGNOSIS — Z716 Tobacco abuse counseling: Secondary | ICD-10-CM | POA: Diagnosis not present

## 2017-07-20 DIAGNOSIS — Z719 Counseling, unspecified: Secondary | ICD-10-CM | POA: Diagnosis not present

## 2017-10-09 DIAGNOSIS — H40033 Anatomical narrow angle, bilateral: Secondary | ICD-10-CM | POA: Diagnosis not present

## 2017-10-09 DIAGNOSIS — H04123 Dry eye syndrome of bilateral lacrimal glands: Secondary | ICD-10-CM | POA: Diagnosis not present

## 2018-02-10 DIAGNOSIS — Z716 Tobacco abuse counseling: Secondary | ICD-10-CM | POA: Diagnosis not present

## 2018-02-10 DIAGNOSIS — F17210 Nicotine dependence, cigarettes, uncomplicated: Secondary | ICD-10-CM | POA: Diagnosis not present

## 2018-02-24 DIAGNOSIS — F17210 Nicotine dependence, cigarettes, uncomplicated: Secondary | ICD-10-CM | POA: Diagnosis not present

## 2018-02-24 DIAGNOSIS — Z716 Tobacco abuse counseling: Secondary | ICD-10-CM | POA: Diagnosis not present

## 2018-03-03 DIAGNOSIS — Z716 Tobacco abuse counseling: Secondary | ICD-10-CM | POA: Diagnosis not present

## 2018-03-03 DIAGNOSIS — F17210 Nicotine dependence, cigarettes, uncomplicated: Secondary | ICD-10-CM | POA: Diagnosis not present

## 2018-03-10 DIAGNOSIS — Z716 Tobacco abuse counseling: Secondary | ICD-10-CM | POA: Diagnosis not present

## 2018-03-10 DIAGNOSIS — F17210 Nicotine dependence, cigarettes, uncomplicated: Secondary | ICD-10-CM | POA: Diagnosis not present

## 2018-04-30 ENCOUNTER — Other Ambulatory Visit: Payer: Self-pay | Admitting: Podiatry

## 2018-04-30 ENCOUNTER — Ambulatory Visit: Payer: BLUE CROSS/BLUE SHIELD | Admitting: Podiatry

## 2018-04-30 ENCOUNTER — Encounter: Payer: Self-pay | Admitting: Podiatry

## 2018-04-30 ENCOUNTER — Ambulatory Visit (INDEPENDENT_AMBULATORY_CARE_PROVIDER_SITE_OTHER): Payer: BLUE CROSS/BLUE SHIELD

## 2018-04-30 DIAGNOSIS — M779 Enthesopathy, unspecified: Secondary | ICD-10-CM

## 2018-04-30 DIAGNOSIS — M25571 Pain in right ankle and joints of right foot: Secondary | ICD-10-CM

## 2018-05-03 NOTE — Progress Notes (Signed)
Subjective:   Patient ID: Julia Dunlap, female   DOB: 52 y.o.   MRN: [id number]   HPI Patient presents stating she has had swelling around her right ankle and she is concerned about injury arthritis or other pathology.  Patient does not smoke and likes to be active   Review of Systems  All other systems reviewed and are negative.       Objective:  Physical Exam  Constitutional: She appears well-developed and well-nourished.  Cardiovascular: Intact distal pulses.  Pulmonary/Chest: Effort normal.  Musculoskeletal: Normal range of motion.  Neurological: She is alert.  Skin: Skin is warm.  Nursing note and vitals reviewed.   Neurovascular status found to be intact muscle strength adequate with mild swelling around the right ankle lateral side with mild edema noted and no significant other pathology.  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  Patient will also wear shoe gear the provides for support and was be seen back for [place] to recheck again in the next 6 weeks or as needed

## 2018-05-24 DIAGNOSIS — Z008 Encounter for other general examination: Secondary | ICD-10-CM | POA: Diagnosis not present

## 2018-05-24 DIAGNOSIS — Z716 Tobacco abuse counseling: Secondary | ICD-10-CM | POA: Diagnosis not present

## 2018-05-24 DIAGNOSIS — F17210 Nicotine dependence, cigarettes, uncomplicated: Secondary | ICD-10-CM | POA: Diagnosis not present

## 2018-05-24 DIAGNOSIS — Z719 Counseling, unspecified: Secondary | ICD-10-CM | POA: Diagnosis not present

## 2018-07-26 DIAGNOSIS — Z716 Tobacco abuse counseling: Secondary | ICD-10-CM | POA: Diagnosis not present

## 2018-07-26 DIAGNOSIS — F17210 Nicotine dependence, cigarettes, uncomplicated: Secondary | ICD-10-CM | POA: Diagnosis not present

## 2018-07-26 DIAGNOSIS — Z719 Counseling, unspecified: Secondary | ICD-10-CM | POA: Diagnosis not present

## 2018-07-26 DIAGNOSIS — Z008 Encounter for other general examination: Secondary | ICD-10-CM | POA: Diagnosis not present

## 2018-08-31 ENCOUNTER — Other Ambulatory Visit: Payer: Self-pay | Admitting: Women's Health

## 2018-08-31 DIAGNOSIS — Z1231 Encounter for screening mammogram for malignant neoplasm of breast: Secondary | ICD-10-CM

## 2018-09-27 ENCOUNTER — Ambulatory Visit
Admission: RE | Admit: 2018-09-27 | Discharge: 2018-09-27 | Disposition: A | Discharge status: Home / Self Care | Payer: BLUE CROSS/BLUE SHIELD | Source: Ambulatory Visit | Attending: Women's Health | Admitting: Women's Health

## 2018-09-27 DIAGNOSIS — Z1231 Encounter for screening mammogram for malignant neoplasm of breast: Secondary | ICD-10-CM | POA: Diagnosis not present

## 2018-10-19 ENCOUNTER — Encounter: Payer: Self-pay | Admitting: Women's Health

## 2018-10-19 ENCOUNTER — Ambulatory Visit (INDEPENDENT_AMBULATORY_CARE_PROVIDER_SITE_OTHER): Payer: BLUE CROSS/BLUE SHIELD | Admitting: Women's Health

## 2018-10-19 VITALS — BP 122/80 | Ht 63.0 in | Wt 141.0 lb

## 2018-10-19 DIAGNOSIS — Z01419 Encounter for gynecological examination (general) (routine) without abnormal findings: Secondary | ICD-10-CM | POA: Diagnosis not present

## 2018-10-19 LAB — CBC WITH DIFFERENTIAL/PLATELET
BASOS ABS: 62 {cells}/uL (ref 0–200)
Basophils Relative: 0.7 %
EOS PCT: 0.9 %
Eosinophils Absolute: 79 cells/uL (ref 15–500)
HCT: 38.4 % (ref 35.0–45.0)
Hemoglobin: 13.3 g/dL (ref 11.7–15.5)
Lymphs Abs: 2693 cells/uL (ref 850–3900)
MCH: 29.6 pg (ref 27.0–33.0)
MCHC: 34.6 g/dL (ref 32.0–36.0)
MCV: 85.5 fL (ref 80.0–100.0)
MPV: 9.5 fL (ref 7.5–12.5)
Monocytes Relative: 5.9 %
NEUTROS PCT: 61.9 %
Neutro Abs: 5447 cells/uL (ref 1500–7800)
PLATELETS: 307 10*3/uL (ref 140–400)
RBC: 4.49 10*6/uL (ref 3.80–5.10)
RDW: 12.2 % (ref 11.0–15.0)
TOTAL LYMPHOCYTE: 30.6 %
WBC mixed population: 519 cells/uL (ref 200–950)
WBC: 8.8 10*3/uL (ref 3.8–10.8)

## 2018-10-19 NOTE — Progress Notes (Signed)
Julia Dunlap October 13, 1966 [id number]    History:    Presents for annual exam.  2008 ablation amenorrheic BTL.  Normal Pap and mammogram history.  Sister diagnosed with breast cancer this past year BRCA negative.  Separated from husband divorce pending alcoholic no infidelity.  Denies need for STD screen.  Past medical history, past surgical history, family history and social history were all reviewed and documented in the EPIC chart.  Works at [employer] had normal health screening labs of glucose and lipid panel.  Twins 73 doing well, 71 year old daughter okay, poor relationship with father, 3 children have had Gardasil.  2014 history of alcohol abuse no alcohol greater than 5 years.  Had lost weight several years back and has kept most of it off.  History of a ruptured appendix at age 89.  ROS:  A ROS was performed and pertinent positives and negatives are included.  Exam:  Vitals:   10/19/18 1407  BP: 122/80  Weight: 141 lb (64 kg)  Height: 5' 3" (1.6 m)   Body mass index is 24.98 kg/m.   General appearance:  Normal Thyroid:  Symmetrical, normal in size, without palpable masses or nodularity. Respiratory  Auscultation:  Clear without wheezing or rhonchi Cardiovascular  Auscultation:  Regular rate, without rubs, murmurs or gallops  Edema/varicosities:  Not grossly evident Abdominal  Soft,nontender, without masses, guarding or rebound.  Liver/spleen:  No organomegaly noted  Hernia:  None appreciated  Skin  Inspection:  Grossly normal   Breasts: Examined lying and sitting.     Right: Without masses, retractions, discharge or axillary adenopathy.     Left: Without masses, retractions, discharge or axillary adenopathy. Gentitourinary   Inguinal/mons:  Normal without inguinal adenopathy  External genitalia:  Normal  BUS/Urethra/Skene's glands:  Normal  Vagina:  Normal  Cervix:  Normal  Uterus:  normal in size, shape and contour.  Midline and mobile  Adnexa/parametria:      Rt: Without masses or tenderness.   Lt: Without masses or tenderness.  Anus and perineum: Normal  Digital rectal exam: Normal sphincter tone without palpated masses or tenderness  Assessment/Plan:  52 y.o. D WF G3, P3 for annual exam no complaints.    2008 endometrial ablation/BTL/amenorrheic minimal menopausal symptoms Reports normal health screening labs at work  Plan: Encouraged counseling for recent divorce.  SBEs, continue annual screening mammogram, exercise, calcium rich foods, vitamin D 2000 daily encouraged.  CBC, Pap normal 2017, new screening guidelines reviewed.    [name] [facility], 5:20 PM 10/19/2018

## 2018-10-19 NOTE — Patient Instructions (Signed)
Health Maintenance for Postmenopausal Women Menopause is a normal process in which your reproductive ability comes to an end. This process happens gradually over a span of months to years, usually between the ages of 22 and 9. Menopause is complete when you have missed 12 consecutive menstrual periods. It is important to talk with your health care provider about some of the most common conditions that affect postmenopausal women, such as heart disease, cancer, and bone loss (osteoporosis). Adopting a healthy lifestyle and getting preventive care can help to promote your health and wellness. Those actions can also lower your chances of developing some of these common conditions. What should I know about menopause? During menopause, you may experience a number of symptoms, such as:  Moderate-to-severe hot flashes.  Night sweats.  Decrease in sex drive.  Mood swings.  Headaches.  Tiredness.  Irritability.  Memory problems.  Insomnia.  Choosing to treat or not to treat menopausal changes is an individual decision that you make with your health care provider. What should I know about hormone replacement therapy and supplements? Hormone therapy products are effective for treating symptoms that are associated with menopause, such as hot flashes and night sweats. Hormone replacement carries certain risks, especially as you become older. If you are thinking about using estrogen or estrogen with progestin treatments, discuss the benefits and risks with your health care provider. What should I know about heart disease and stroke? Heart disease, heart attack, and stroke become more likely as you age. This may be due, in part, to the hormonal changes that your body experiences during menopause. These can affect how your body processes dietary fats, triglycerides, and cholesterol. Heart attack and stroke are both medical emergencies. There are many things that you can do to help prevent heart disease  and stroke:  Have your blood pressure checked at least every 1-2 years. High blood pressure causes heart disease and increases the risk of stroke.  If you are 53-22 years old, ask your health care provider if you should take aspirin to prevent a heart attack or a stroke.  Do not use any tobacco products, including cigarettes, chewing tobacco, or electronic cigarettes. If you need help quitting, ask your health care provider.  It is important to eat a healthy diet and maintain a healthy weight. ? Be sure to include plenty of vegetables, fruits, low-fat dairy products, and lean protein. ? Avoid eating foods that are high in solid fats, added sugars, or salt (sodium).  Get regular exercise. This is one of the most important things that you can do for your health. ? Try to exercise for at least 150 minutes each week. The type of exercise that you do should increase your heart rate and make you sweat. This is known as moderate-intensity exercise. ? Try to do strengthening exercises at least twice each week. Do these in addition to the moderate-intensity exercise.  Know your numbers.Ask your health care provider to check your cholesterol and your blood glucose. Continue to have your blood tested as directed by your health care provider.  What should I know about cancer screening? There are several types of cancer. Take the following steps to reduce your risk and to catch any cancer development as early as possible. Breast Cancer  Practice breast self-awareness. ? This means understanding how your breasts normally appear and feel. ? It also means doing regular breast self-exams. Let your health care provider know about any changes, no matter how small.  If you are 40  or older, have a clinician do a breast exam (clinical breast exam or CBE) every year. Depending on your age, family history, and medical history, it may be recommended that you also have a yearly breast X-ray (mammogram).  If you  have a family history of breast cancer, talk with your health care provider about genetic screening.  If you are at high risk for breast cancer, talk with your health care provider about having an MRI and a mammogram every year.  Breast cancer (BRCA) gene test is recommended for women who have family members with BRCA-related cancers. Results of the assessment will determine the need for genetic counseling and BRCA1 and for BRCA2 testing. BRCA-related cancers include these types: ? Breast. This occurs in males or females. ? Ovarian. ? Tubal. This may also be called fallopian tube cancer. ? Cancer of the abdominal or pelvic lining (peritoneal cancer). ? Prostate. ? Pancreatic.  Cervical, Uterine, and Ovarian Cancer Your health care provider may recommend that you be screened regularly for cancer of the pelvic organs. These include your ovaries, uterus, and vagina. This screening involves a pelvic exam, which includes checking for microscopic changes to the surface of your cervix (Pap test).  For women ages 21-65, health care providers may recommend a pelvic exam and a Pap test every three years. For women ages 79-65, they may recommend the Pap test and pelvic exam, combined with testing for human papilloma virus (HPV), every five years. Some types of HPV increase your risk of cervical cancer. Testing for HPV may also be done on women of any age who have unclear Pap test results.  Other health care providers may not recommend any screening for nonpregnant women who are considered low risk for pelvic cancer and have no symptoms. Ask your health care provider if a screening pelvic exam is right for you.  If you have had past treatment for cervical cancer or a condition that could lead to cancer, you need Pap tests and screening for cancer for at least 20 years after your treatment. If Pap tests have been discontinued for you, your risk factors (such as having a new sexual partner) need to be  reassessed to determine if you should start having screenings again. Some women have medical problems that increase the chance of getting cervical cancer. In these cases, your health care provider may recommend that you have screening and Pap tests more often.  If you have a family history of uterine cancer or ovarian cancer, talk with your health care provider about genetic screening.  If you have vaginal bleeding after reaching menopause, tell your health care provider.  There are currently no reliable tests available to screen for ovarian cancer.  Lung Cancer Lung cancer screening is recommended for adults 69-62 years old who are at high risk for lung cancer because of a history of smoking. A yearly low-dose CT scan of the lungs is recommended if you:  Currently smoke.  Have a history of at least 30 pack-years of smoking and you currently smoke or have quit within the past 15 years. A pack-year is smoking an average of one pack of cigarettes per day for one year.  Yearly screening should:  Continue until it has been 15 years since you quit.  Stop if you develop a health problem that would prevent you from having lung cancer treatment.  Colorectal Cancer  This type of cancer can be detected and can often be prevented.  Routine colorectal cancer screening usually begins at  age 42 and continues through age 45.  If you have risk factors for colon cancer, your health care provider may recommend that you be screened at an earlier age.  If you have a family history of colorectal cancer, talk with your health care provider about genetic screening.  Your health care provider may also recommend using home test kits to check for hidden blood in your stool.  A small camera at the end of a tube can be used to examine your colon directly (sigmoidoscopy or colonoscopy). This is done to check for the earliest forms of colorectal cancer.  Direct examination of the colon should be repeated every  5-10 years until age 71. However, if early forms of precancerous polyps or small growths are found or if you have a family history or genetic risk for colorectal cancer, you may need to be screened more often.  Skin Cancer  Check your skin from head to toe regularly.  Monitor any moles. Be sure to tell your health care provider: ? About any new moles or changes in moles, especially if there is a change in a mole's shape or color. ? If you have a mole that is larger than the size of a pencil eraser.  If any of your family members has a history of skin cancer, especially at a Doug Bucklin age, talk with your health care provider about genetic screening.  Always use sunscreen. Apply sunscreen liberally and repeatedly throughout the day.  Whenever you are outside, protect yourself by wearing long sleeves, pants, a wide-brimmed hat, and sunglasses.  What should I know about osteoporosis? Osteoporosis is a condition in which bone destruction happens more quickly than new bone creation. After menopause, you may be at an increased risk for osteoporosis. To help prevent osteoporosis or the bone fractures that can happen because of osteoporosis, the following is recommended:  If you are 46-71 years old, get at least 1,000 mg of calcium and at least 600 mg of vitamin D per day.  If you are older than age 55 but younger than age 65, get at least 1,200 mg of calcium and at least 600 mg of vitamin D per day.  If you are older than age 54, get at least 1,200 mg of calcium and at least 800 mg of vitamin D per day.  Smoking and excessive alcohol intake increase the risk of osteoporosis. Eat foods that are rich in calcium and vitamin D, and do weight-bearing exercises several times each week as directed by your health care provider. What should I know about how menopause affects my mental health? Depression may occur at any age, but it is more common as you become older. Common symptoms of depression  include:  Low or sad mood.  Changes in sleep patterns.  Changes in appetite or eating patterns.  Feeling an overall lack of motivation or enjoyment of activities that you previously enjoyed.  Frequent crying spells.  Talk with your health care provider if you think that you are experiencing depression. What should I know about immunizations? It is important that you get and maintain your immunizations. These include:  Tetanus, diphtheria, and pertussis (Tdap) booster vaccine.  Influenza every year before the flu season begins.  Pneumonia vaccine.  Shingles vaccine.  Your health care provider may also recommend other immunizations. This information is not intended to replace advice given to you by your health care provider. Make sure you discuss any questions you have with your health care provider. Document Released: 01/30/2006  Document Revised: 06/27/2016 Document Reviewed: 09/11/2015 Elsevier Interactive Patient Education  2018 [employer]  

## 2019-10-25 ENCOUNTER — Other Ambulatory Visit: Payer: Self-pay | Admitting: Women's Health

## 2019-10-25 DIAGNOSIS — Z1231 Encounter for screening mammogram for malignant neoplasm of breast: Secondary | ICD-10-CM

## 2019-11-14 ENCOUNTER — Other Ambulatory Visit: Payer: Self-pay

## 2019-11-15 ENCOUNTER — Encounter: Payer: Self-pay | Admitting: Women's Health

## 2019-11-15 ENCOUNTER — Ambulatory Visit (INDEPENDENT_AMBULATORY_CARE_PROVIDER_SITE_OTHER): Payer: BC Managed Care – PPO | Admitting: Women's Health

## 2019-11-15 VITALS — BP 120/82 | Ht 63.0 in | Wt 142.0 lb

## 2019-11-15 DIAGNOSIS — Z01419 Encounter for gynecological examination (general) (routine) without abnormal findings: Secondary | ICD-10-CM | POA: Diagnosis not present

## 2019-11-15 NOTE — Progress Notes (Signed)
Julia Dunlap April 11, 1966 [id number]    History:    Presents for annual exam.  Postmenopausal on no HRT with no bleeding.  2008 endometrial ablation, BTL.  Normal Pap and mammogram history.  Sister breast cancer BRCA negative.  Not sexually active for several years.  History of alcohol abuse sober greater than 6 years.  Has not had a screening colonoscopy.  Past medical history, past surgical history, family history and social history were all reviewed and documented in the EPIC chart.  Works in [employer].  3 children all doing well, youngest 53.  History of a ruptured appendix at age 53.  ROS:  A ROS was performed and pertinent positives and negatives are included.  Exam:  Vitals:   11/15/19 1500  BP: 120/82  Weight: 142 lb (64.4 kg)  Height: 5' 3"  (1.6 m)   Body mass index is 25.15 kg/m.   General appearance:  Normal Thyroid:  Symmetrical, normal in size, without palpable masses or nodularity. Respiratory  Auscultation:  Clear without wheezing or rhonchi Cardiovascular  Auscultation:  Regular rate, without rubs, murmurs or gallops  Edema/varicosities:  Not grossly evident Abdominal  Soft,nontender, without masses, guarding or rebound.  Liver/spleen:  No organomegaly noted  Hernia:  None appreciated  Skin  Inspection:  Grossly normal   Breasts: Examined lying and sitting.     Right: Without masses, retractions, discharge or axillary adenopathy.     Left: Without masses, retractions, discharge or axillary adenopathy. Gentitourinary   Inguinal/mons:  Normal without inguinal adenopathy  External genitalia:  Normal  BUS/Urethra/Skene's glands:  Normal  Vagina:  Normal  Cervix:  Normal  Uterus: normal in size, shape and contour.  Midline and mobile  Adnexa/parametria:     Rt: Without masses or tenderness.   Lt: Without masses or tenderness.  Anus and perineum: Normal  Digital rectal exam: Normal sphincter tone without palpated masses or tenderness  Assessment/Plan:   53 y.o. DWF G3, P3 for annual exam with no complaints.  Postmenopausal on no HRT with no bleeding 2008 endometrial ablation  Plan: SBEs, continue annual screening mammogram, calcium rich foods, vitamin D 2000 daily encouraged.  Reviewed importance of continuing regular weightbearing exercise, encouraged balance type exercise yoga encouraged.  CBC, CMP, 2017 normal Pap with negative HR HPV typing, new screening guidelines reviewed.     [name] [facility], 4:41 PM 11/15/2019

## 2019-11-15 NOTE — Patient Instructions (Addendum)
[facility] [id number]  Dr Carlean Purl  Colonoscopy   vit D 2000 iu daily  Good to see you today!  Health Maintenance for Postmenopausal Women Menopause is a normal process in which your ability to get pregnant comes to an end. This process happens slowly over many months or years, usually between the ages of 25 and 13. Menopause is complete when you have missed your menstrual periods for 12 months. It is important to talk with your health care provider about some of the most common conditions that affect women after menopause (postmenopausal women). These include heart disease, cancer, and bone loss (osteoporosis). Adopting a healthy lifestyle and getting preventive care can help to promote your health and wellness. The actions you take can also lower your chances of developing some of these common conditions. What should I know about menopause? During menopause, you may get a number of symptoms, such as:  Hot flashes. These can be moderate or severe.  Night sweats.  Decrease in sex drive.  Mood swings.  Headaches.  Tiredness.  Irritability.  Memory problems.  Insomnia. Choosing to treat or not to treat these symptoms is a decision that you make with your health care provider. Do I need hormone replacement therapy?  Hormone replacement therapy is effective in treating symptoms that are caused by menopause, such as hot flashes and night sweats.  Hormone replacement carries certain risks, especially as you become older. If you are thinking about using estrogen or estrogen with progestin, discuss the benefits and risks with your health care provider. What is my risk for heart disease and stroke? The risk of heart disease, heart attack, and stroke increases as you age. One of the causes may be a change in the body's hormones during menopause. This can affect how your body uses dietary fats, triglycerides, and cholesterol. Heart attack and stroke are medical emergencies. There are many  things that you can do to help prevent heart disease and stroke. Watch your blood pressure  High blood pressure causes heart disease and increases the risk of stroke. This is more likely to develop in people who have high blood pressure readings, are of African descent, or are overweight.  Have your blood pressure checked: ? Every 3-5 years if you are 18-55 years of age. ? Every year if you are 76 years old or older. Eat a healthy diet   Eat a diet that includes plenty of vegetables, fruits, low-fat dairy products, and lean protein.  Do not eat a lot of foods that are high in solid fats, added sugars, or sodium. Get regular exercise Get regular exercise. This is one of the most important things you can do for your health. Most adults should:  Try to exercise for at least 150 minutes each week. The exercise should increase your heart rate and make you sweat (moderate-intensity exercise).  Try to do strengthening exercises at least twice each week. Do these in addition to the moderate-intensity exercise.  Spend less time sitting. Even light physical activity can be beneficial. Other tips  Work with your health care provider to achieve or maintain a healthy weight.  Do not use any products that contain nicotine or tobacco, such as cigarettes, e-cigarettes, and chewing tobacco. If you need help quitting, ask your health care provider.  Know your numbers. Ask your health care provider to check your cholesterol and your blood sugar (glucose). Continue to have your blood tested as directed by your health care provider. Do I need screening for cancer?  Depending on your health history and family history, you may need to have cancer screening at different stages of your life. This may include screening for:  Breast cancer.  Cervical cancer.  Lung cancer.  Colorectal cancer. What is my risk for osteoporosis? After menopause, you may be at increased risk for osteoporosis. Osteoporosis is  a condition in which bone destruction happens more quickly than new bone creation. To help prevent osteoporosis or the bone fractures that can happen because of osteoporosis, you may take the following actions:  If you are 58-58 years old, get at least 1,000 mg of calcium and at least 600 mg of vitamin D per day.  If you are older than age 45 but younger than age 38, get at least 1,200 mg of calcium and at least 600 mg of vitamin D per day.  If you are older than age 47, get at least 1,200 mg of calcium and at least 800 mg of vitamin D per day. Smoking and drinking excessive alcohol increase the risk of osteoporosis. Eat foods that are rich in calcium and vitamin D, and do weight-bearing exercises several times each week as directed by your health care provider. How does menopause affect my mental health? Depression may occur at any age, but it is more common as you become older. Common symptoms of depression include:  Low or sad mood.  Changes in sleep patterns.  Changes in appetite or eating patterns.  Feeling an overall lack of motivation or enjoyment of activities that you previously enjoyed.  Frequent crying spells. Talk with your health care provider if you think that you are experiencing depression. General instructions See your health care provider for regular wellness exams and vaccines. This may include:  Scheduling regular health, dental, and eye exams.  Getting and maintaining your vaccines. These include: ? Influenza vaccine. Get this vaccine each year before the flu season begins. ? Pneumonia vaccine. ? Shingles vaccine. ? Tetanus, diphtheria, and pertussis (Tdap) booster vaccine. Your health care provider may also recommend other immunizations. Tell your health care provider if you have ever been abused or do not feel safe at home. Summary  Menopause is a normal process in which your ability to get pregnant comes to an end.  This condition causes hot flashes, night  sweats, decreased interest in sex, mood swings, headaches, or lack of sleep.  Treatment for this condition may include hormone replacement therapy.  Take actions to keep yourself healthy, including exercising regularly, eating a healthy diet, watching your weight, and checking your blood pressure and blood sugar levels.  Get screened for cancer and depression. Make sure that you are up to date with all your vaccines. This information is not intended to replace advice given to you by your health care provider. Make sure you discuss any questions you have with your health care provider. Document Released: 01/30/2006 Document Revised: 12/01/2018 Document Reviewed: 12/01/2018 Elsevier Patient Education  2020 [employer].

## 2019-11-17 LAB — CBC WITH DIFFERENTIAL/PLATELET
Absolute Monocytes: 435 cells/uL (ref 200–950)
Basophils Absolute: 58 cells/uL (ref 0–200)
Basophils Relative: 0.9 %
Eosinophils Absolute: 141 cells/uL (ref 15–500)
Eosinophils Relative: 2.2 %
HCT: 39.4 % (ref 35.0–45.0)
Hemoglobin: 13.4 g/dL (ref 11.7–15.5)
Lymphs Abs: 2227 cells/uL (ref 850–3900)
MCH: 29.3 pg (ref 27.0–33.0)
MCHC: 34 g/dL (ref 32.0–36.0)
MCV: 86 fL (ref 80.0–100.0)
MPV: 9.3 fL (ref 7.5–12.5)
Monocytes Relative: 6.8 %
Neutro Abs: 3539 cells/uL (ref 1500–7800)
Neutrophils Relative %: 55.3 %
Platelets: 287 10*3/uL (ref 140–400)
RBC: 4.58 10*6/uL (ref 3.80–5.10)
RDW: 12.3 % (ref 11.0–15.0)
Total Lymphocyte: 34.8 %
WBC: 6.4 10*3/uL (ref 3.8–10.8)

## 2019-11-17 LAB — TEST AUTHORIZATION

## 2019-11-17 LAB — COMPREHENSIVE METABOLIC PANEL
AG Ratio: 2.1 (calc) (ref 1.0–2.5)
ALT: 21 U/L (ref 6–29)
AST: 19 U/L (ref 10–35)
Albumin: 4.6 g/dL (ref 3.6–5.1)
Alkaline phosphatase (APISO): 40 U/L (ref 37–153)
BUN: 13 mg/dL (ref 7–25)
CO2: 26 mmol/L (ref 20–32)
Calcium: 9.3 mg/dL (ref 8.6–10.4)
Chloride: 105 mmol/L (ref 98–110)
Creat: 0.74 mg/dL (ref 0.50–1.05)
Globulin: 2.2 g/dL (calc) (ref 1.9–3.7)
Glucose, Bld: 104 mg/dL — ABNORMAL HIGH (ref 65–99)
Potassium: 3.7 mmol/L (ref 3.5–5.3)
Sodium: 140 mmol/L (ref 135–146)
Total Bilirubin: 0.4 mg/dL (ref 0.2–1.2)
Total Protein: 6.8 g/dL (ref 6.1–8.1)

## 2019-11-17 LAB — HEMOGLOBIN A1C W/OUT EAG: Hgb A1c MFr Bld: 5.2 % of total Hgb (ref <5.7)

## 2019-12-02 NOTE — Telephone Encounter (Signed)
My chart message came back unread, patient informed with message.

## 2019-12-13 ENCOUNTER — Ambulatory Visit: Payer: BLUE CROSS/BLUE SHIELD

## 2020-03-22 ENCOUNTER — Other Ambulatory Visit: Payer: Self-pay

## 2020-03-22 ENCOUNTER — Ambulatory Visit: Payer: BC Managed Care – PPO | Admitting: Physician Assistant

## 2020-03-22 ENCOUNTER — Telehealth: Payer: Self-pay | Admitting: Physician Assistant

## 2020-03-22 ENCOUNTER — Encounter: Payer: Self-pay | Admitting: Physician Assistant

## 2020-03-22 DIAGNOSIS — C44719 Basal cell carcinoma of skin of left lower limb, including hip: Secondary | ICD-10-CM | POA: Diagnosis not present

## 2020-03-22 DIAGNOSIS — K130 Diseases of lips: Secondary | ICD-10-CM | POA: Diagnosis not present

## 2020-03-22 DIAGNOSIS — L570 Actinic keratosis: Secondary | ICD-10-CM | POA: Diagnosis not present

## 2020-03-22 DIAGNOSIS — C44712 Basal cell carcinoma of skin of right lower limb, including hip: Secondary | ICD-10-CM

## 2020-03-22 DIAGNOSIS — D492 Neoplasm of unspecified behavior of bone, soft tissue, and skin: Secondary | ICD-10-CM

## 2020-03-22 MED ORDER — MUPIROCIN 2 % EX OINT: 1.0000 "application " | TOPICAL_OINTMENT | Freq: Two times a day (BID) | CUTANEOUS | 6 refills | Status: DC

## 2020-03-22 NOTE — Telephone Encounter (Signed)
Left message for patient to call us back.  

## 2020-03-22 NOTE — Telephone Encounter (Signed)
Patient called back, informed her to keep biopsy site covered for a few days with Vaseline.  I also informed her that [facility] told her to Never get back in the tanning bed.

## 2020-03-22 NOTE — Telephone Encounter (Signed)
Patient was seen today and is calling back to ask if she keeps the biopsy site covered, can she still use tanning bed? Patient says to leave a detailed message on cell phone if does not answer. Chart [phone]

## 2020-03-22 NOTE — Progress Notes (Addendum)
   Follow-Up Visit   Subjective  Julia Dunlap is a 54 y.o. female who presents for the following: Skin Problem (She has a couple of places around her mouth for about 2-3 weeks. States they are just crusty and raw looking.  Worried because her son is getting married in 2 weeks.  Has been keeping them moisturized.  Has a place on her left mid thigh that is very pinkish red.  Flakes occasionally and has small history of bleeding.).    The following portions of the chart were reviewed this encounter and updated as appropriate: Tobacco  Allergies  Meds  Problems  Med Hx  Surg Hx  Fam Hx      Objective  Well appearing patient in no apparent distress; mood and affect are within normal limits.   An examination was performed including scalp, head, eyes, ears, nose, lips, and left leg. Objective  Left Oral Commissure, Right Upper Vermilion Lip: Outer crease of mouth. Red with slight scale.  Objective  Left Thigh - Anterior (2): Erythematous patches with gritty scale.  Objective  Right Thigh - Anterior: pearly papule with arborizing vessels.   Assessment & Plan  Angular cheilitis (2) Left Oral Commissure; Right Upper Vermilion Lip  OTC clotrimazole at nightnd hydrocortisone ointment daily  mupirocin ointment (BACTROBAN) 2 % - Left Oral Commissure, Right Upper Vermilion Lip  Neoplasm of skin  Other Related Procedures Surgical pathology  AK (actinic keratosis) (2) Left Thigh - Anterior  Destruction of lesion - Left Thigh - Anterior (2) Complexity: simple   Destruction method: cryotherapy   Informed consent: discussed and consent obtained   Timeout:  patient name, date of birth, surgical site, and procedure verified Lesion destroyed using liquid nitrogen: Yes   Region frozen until ice ball extended beyond lesion: Yes   Cryotherapy cycles:  1 Outcome: patient tolerated procedure well with no complications   Post-procedure details: wound care instructions given       Basal cell carcinoma of skin of right lower limb, including hip Right Thigh - Anterior  Skin / nail biopsy Type of biopsy: tangential   Informed consent: discussed and consent obtained   Timeout: patient name, date of birth, surgical site, and procedure verified   Anesthesia: the lesion was anesthetized in a standard fashion   Anesthetic:  1% lidocaine w/ epinephrine 1-100,000 local infiltration Instrument used: flexible razor blade   Hemostasis achieved with: aluminum chloride and electrodesiccation   Outcome: patient tolerated procedure well   Post-procedure details: wound care instructions given    Destruction of lesion Complexity: simple   Destruction method: electrodesiccation and curettage   Informed consent: discussed and consent obtained   Timeout:  patient name, date of birth, surgical site, and procedure verified Anesthesia: the lesion was anesthetized in a standard fashion   Anesthetic:  1% lidocaine w/ epinephrine 1-100,000 local infiltration Curettage performed in three different directions: Yes   Curettage cycles:  3 Lesion length (cm):  1 Lesion width (cm):  1 Margin per side (cm):  0.1 Final wound size (cm):  1.2 Hemostasis achieved with:  aluminum chloride Outcome: patient tolerated procedure well with no complications   Post-procedure details: wound care instructions given   I, Blaike Vickers, PA-C, have reviewed all documentation for this visit. The documentation on 08/26/20 for the exam, diagnosis, procedures, and orders are all accurate and complete.

## 2020-03-22 NOTE — Patient Instructions (Signed)

## 2020-03-27 NOTE — Addendum Note (Signed)
Addended by: Warren Danes on: 03/27/2020 03:53 PM   Modules accepted: Orders

## 2020-03-28 ENCOUNTER — Encounter: Payer: Self-pay | Admitting: *Deleted

## 2020-03-28 ENCOUNTER — Telehealth: Payer: Self-pay | Admitting: *Deleted

## 2020-03-28 ENCOUNTER — Telehealth: Payer: Self-pay | Admitting: Physician Assistant

## 2020-03-28 NOTE — Telephone Encounter (Signed)
See patient note

## 2020-03-28 NOTE — Telephone Encounter (Signed)
-----   Message from Warren Danes, [place] sent at 03/27/2020  4:07 PM EDT ----- Recheck 3-6 months

## 2020-03-28 NOTE — Telephone Encounter (Signed)
Pathology to patient.  Patient will call back to schedule 3-6 month follow up.

## 2020-03-28 NOTE — Telephone Encounter (Signed)
Patient is calling saying that the prescription she was given is not helping the cracks at the corners of her mouth.  If anything her condition is worse.  The medications are Mupirocin 2% and an over-the-counter cream.  What should she do?  Her son is getting married in about one and a half weeks.  Chart # [id number].  [facility] is [facility] in [place], [place].

## 2020-03-29 MED ORDER — ALCLOMETASONE DIPROPIONATE 0.05 % EX CREA: TOPICAL_CREAM | Freq: Two times a day (BID) | CUTANEOUS | 1 refills | Status: DC | PRN

## 2020-03-29 NOTE — Telephone Encounter (Signed)
Called patient back to let her know to use Clotrimazole and Hydrocortisone on lips.  Mupirocin is for nose and any wound.  Also told patient that per kelli if hydrocortisone isnt working then she will give patient Rx for alclometasone.  Patient wanted prescription-sent to patients pharmacy.

## 2020-03-29 NOTE — Telephone Encounter (Signed)
Mupirocin is for intranasal prevention of MRSA but can be used on any wound.  Clotrimazole plus hydrocortisone for the lips. Call in Rx for alclometasone to replace the hydrocortisone... it is stronger and more likely to work faster!  Happy Wedding day to her!!

## 2020-03-29 NOTE — Telephone Encounter (Signed)
Patient is calling because she has not heard from anyone in our office and was told to call if this happened.  Patient says to leave a detailed message if does not answer.

## 2020-04-12 ENCOUNTER — Encounter: Payer: Self-pay | Admitting: Family Medicine

## 2020-04-17 ENCOUNTER — Encounter: Payer: Self-pay | Admitting: Family Medicine

## 2020-05-15 ENCOUNTER — Other Ambulatory Visit: Payer: Self-pay

## 2020-05-15 ENCOUNTER — Ambulatory Visit
Admission: RE | Admit: 2020-05-15 | Discharge: 2020-05-15 | Disposition: A | Discharge status: Home / Self Care | Payer: BC Managed Care – PPO | Source: Ambulatory Visit | Attending: Women's Health | Admitting: Women's Health

## 2020-05-15 DIAGNOSIS — Z1231 Encounter for screening mammogram for malignant neoplasm of breast: Secondary | ICD-10-CM

## 2020-07-04 NOTE — Telephone Encounter (Signed)
This encounter was created in error - please disregard.

## 2020-07-05 ENCOUNTER — Telehealth: Payer: Self-pay | Admitting: Physician Assistant

## 2020-07-05 MED ORDER — CEPHALEXIN 500 MG PO CAPS: 500.0000 mg | ORAL_CAPSULE | Freq: Two times a day (BID) | ORAL | 0 refills | Status: AC

## 2020-07-05 NOTE — Telephone Encounter (Signed)
Rx: keflex 500 bid #[id number]

## 2020-07-05 NOTE — Telephone Encounter (Signed)
Phone call to patient with Julia Dunlap recommendations.  Patient aware.

## 2020-07-05 NOTE — Telephone Encounter (Signed)
See patient's message.

## 2020-07-05 NOTE — Telephone Encounter (Signed)
Patient is calling saying that rash is back at corners of her mouth.  She has started using the prescription cream that was prescribed before, but it still is not clearing completely.  Can she try another medication?  [facility] [place], [place].  (Chart [id number])

## 2020-07-11 ENCOUNTER — Encounter: Payer: Self-pay | Admitting: Family Medicine

## 2020-07-31 NOTE — Progress Notes (Deleted)
I, Orland Visconti, PA-C, have reviewed all documentation for this visit. The documentation on 07/31/20 for the exam, diagnosis, procedures, and orders are all accurate and complete.

## 2020-09-19 ENCOUNTER — Telehealth: Payer: Self-pay | Admitting: Dermatology

## 2020-09-19 DIAGNOSIS — K130 Diseases of lips: Secondary | ICD-10-CM

## 2020-09-19 MED ORDER — FLUCONAZOLE 200 MG PO TABS: 200.0000 mg | ORAL_TABLET | Freq: Every day | ORAL | 0 refills | Status: DC

## 2020-09-19 MED ORDER — ALCLOMETASONE DIPROPIONATE 0.05 % EX CREA: TOPICAL_CREAM | Freq: Two times a day (BID) | CUTANEOUS | 1 refills | Status: DC | PRN

## 2020-09-19 MED ORDER — MUPIROCIN 2 % EX OINT: 1.0000 "application " | TOPICAL_OINTMENT | Freq: Two times a day (BID) | CUTANEOUS | 6 refills | Status: DC

## 2020-09-19 NOTE — Telephone Encounter (Signed)
Patient is calling to say that the corners of her mouth are starting to crack again and medications are not helping.  What can she do?

## 2020-09-19 NOTE — Telephone Encounter (Signed)
Rx: fluconazole 200. One pill every 3 days at night. Side effect headache  Ok to try mupirocin at night and alclometasone daily. 2-3 times per day.  Make sure she uses an ointment at night to cover the areas. Cerave healing on top of the mupirocin.

## 2020-09-19 NOTE — Telephone Encounter (Signed)
Patient aware of Julia Dunlap note and directions.

## 2020-10-02 ENCOUNTER — Encounter: Payer: Self-pay | Admitting: Physician Assistant

## 2020-10-02 NOTE — Telephone Encounter (Signed)
Rx: fluconazole 200. One pill every 3 days at night. Side effect headache  Ok to try mupirocin at night and alclometasone daily. 2-3 times per day.  Make sure she uses an ointment at night to cover the areas. Cerave healing on top of the mupirocin.

## 2020-10-03 ENCOUNTER — Telehealth: Payer: Self-pay | Admitting: Physician Assistant

## 2020-10-03 NOTE — Telephone Encounter (Signed)
Spoke with patient assured her we were working on it and Dr Pearline Cables otc is a great help with our Isotretinoin patients. If Dr Denna Haggard has any other additions we will call her. She also asked if a low dose antibiotic would help? 2nd message from patient

## 2020-10-03 NOTE — Telephone Encounter (Signed)
Saw Kelli for cracks on mouth; goes away, but not totally, and comes back, using what KRS Rx'd. Wants to know if there's something else she can try. [facility] [place]. Said she tried [facility] [address].

## 2020-10-04 MED ORDER — TRIAMCINOLONE ACETONIDE 0.1 % EX OINT: 1.0000 "application " | TOPICAL_OINTMENT | Freq: Every day | CUTANEOUS | 0 refills | Status: DC

## 2020-10-04 NOTE — Telephone Encounter (Signed)
Patient stated she spoke with a friend and she had similar issue and she changed tooth paste. Julia Dunlap uses arm and hammer baking soda and washes her mask for her face in baking soda also. She will start washing her mask in all free and clear start new tooth paste and start new rx per Dr Denna Haggard triamcinolone 0.1% ointment at night. Patient is to call office with update 7-10 days.

## 2021-01-24 DIAGNOSIS — H8309 Labyrinthitis, unspecified ear: Secondary | ICD-10-CM | POA: Diagnosis not present

## 2021-01-24 DIAGNOSIS — Z6825 Body mass index (BMI) 25.0-25.9, adult: Secondary | ICD-10-CM | POA: Diagnosis not present

## 2021-01-25 ENCOUNTER — Ambulatory Visit: Payer: BC Managed Care – PPO | Admitting: Obstetrics and Gynecology

## 2021-04-15 ENCOUNTER — Encounter: Payer: Self-pay | Admitting: Family Medicine

## 2022-05-27 ENCOUNTER — Other Ambulatory Visit: Payer: Self-pay | Admitting: Radiology

## 2022-05-27 DIAGNOSIS — Z1231 Encounter for screening mammogram for malignant neoplasm of breast: Secondary | ICD-10-CM

## 2022-06-10 ENCOUNTER — Ambulatory Visit
Admission: RE | Admit: 2022-06-10 | Discharge: 2022-06-10 | Disposition: A | Discharge status: Home / Self Care | Payer: BC Managed Care – PPO | Source: Ambulatory Visit | Attending: Radiology | Admitting: Radiology

## 2022-06-10 ENCOUNTER — Other Ambulatory Visit (HOSPITAL_COMMUNITY)
Admission: RE | Admit: 2022-06-10 | Discharge: 2022-06-10 | Disposition: A | Discharge status: Home / Self Care | Payer: BC Managed Care – PPO | Source: Ambulatory Visit | Attending: Radiology | Admitting: Radiology

## 2022-06-10 ENCOUNTER — Ambulatory Visit (INDEPENDENT_AMBULATORY_CARE_PROVIDER_SITE_OTHER): Payer: BC Managed Care – PPO | Admitting: Radiology

## 2022-06-10 ENCOUNTER — Encounter: Payer: Self-pay | Admitting: Radiology

## 2022-06-10 VITALS — BP 122/78 | Ht 62.5 in | Wt 151.0 lb

## 2022-06-10 DIAGNOSIS — Z01419 Encounter for gynecological examination (general) (routine) without abnormal findings: Secondary | ICD-10-CM | POA: Diagnosis not present

## 2022-06-10 DIAGNOSIS — Z8639 Personal history of other endocrine, nutritional and metabolic disease: Secondary | ICD-10-CM

## 2022-06-10 DIAGNOSIS — Z1211 Encounter for screening for malignant neoplasm of colon: Secondary | ICD-10-CM

## 2022-06-10 DIAGNOSIS — Z1231 Encounter for screening mammogram for malignant neoplasm of breast: Secondary | ICD-10-CM | POA: Diagnosis not present

## 2022-06-10 DIAGNOSIS — E559 Vitamin D deficiency, unspecified: Secondary | ICD-10-CM | POA: Diagnosis not present

## 2022-06-10 DIAGNOSIS — Z9189 Other specified personal risk factors, not elsewhere classified: Secondary | ICD-10-CM | POA: Diagnosis not present

## 2022-06-10 DIAGNOSIS — R635 Abnormal weight gain: Secondary | ICD-10-CM

## 2022-06-10 NOTE — Progress Notes (Signed)
Julia Dunlap 03-18-1966 [id number]   History: Postmenopausal 56 y.o. G3P3 divorced female presents for annual exam. 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 C/o weight gain and fatigue. Strong family history of osteoporosis and thyroid disease. Sister had breast cancer. No other gyn concerns.    Gynecologic History Postmenopausal Last Pap: 2017. Results were: normal Last mammogram: today. Results were: pending Last colonoscopy: never HRT use: no  Obstetric History OB History  Gravida Para Term Preterm AB Living  3       0 3  SAB IAB Ectopic Multiple Live Births      0        # Outcome Date GA Lbr Len/2nd Weight Sex Delivery Anes PTL Lv  3 Gravida           2 Gravida           1 Gravida              The following portions of the patient's history were reviewed and updated as appropriate: allergies, current medications, past family history, past medical history, past social history, past surgical history, and problem list.  Review of Systems Pertinent items noted in HPI and remainder of comprehensive ROS otherwise negative.  Past medical history, past surgical history, family history and social history were all reviewed and documented in the EPIC chart.  Exam:  Vitals:   06/10/22 1441  BP: 122/78  Weight: 151 lb (68.5 kg)  Height: 5' 2.5" (1.588 m)   Body mass index is 27.18 kg/m.  General appearance:  Normal Thyroid:  Symmetrical, normal in size, without palpable masses or nodularity. Respiratory  Auscultation:  Clear without wheezing or rhonchi Cardiovascular  Auscultation:  Regular rate, without rubs, murmurs or gallops  Edema/varicosities:  Not grossly evident Abdominal  Soft,nontender, without masses, guarding or rebound.  Liver/spleen:  No organomegaly noted  Hernia:  None appreciated  Skin  Inspection:  Grossly normal Breasts: Examined lying and sitting.   Right: Without masses, retractions, nipple discharge or axillary adenopathy.   Left: Without  masses, retractions, nipple discharge or axillary adenopathy. Genitourinary   Inguinal/mons:  Normal without inguinal adenopathy  External genitalia:  Normal appearing vulva with no masses, tenderness, or lesions  BUS/Urethra/Skene's glands:  Normal  Vagina:  Normal appearing with normal color and discharge, no lesions. Atrophy: mild   Cervix:  Normal appearing without discharge or lesions  Uterus:  Normal in size, shape and contour.  Midline and mobile, nontender  Adnexa/parametria:     Rt: Normal in size, without masses or tenderness.   Lt: Normal in size, without masses or tenderness.  Anus and perineum: Normal    Patient informed chaperone available to be present for breast and pelvic exam. Patient has requested no chaperone to be present. Patient has been advised what will be completed during breast and pelvic exam.   Assessment/Plan:   1. Well woman exam with routine gynecological exam  - Cytology - PAP( ) - mammo at [facility] today -lipid panel and hgba1c done at work yearly  2. Screening for colon cancer  - Cologuard  3. Weight gain  - Thyroid Panel With TSH  4. History of vitamin D deficiency  - Vitamin D (25 hydroxy)  5. At risk for osteoporosis  - DG Bone Density; Future    Discussed SBE, colonoscopy and DEXA screening as directed. Recommend 136mns of exercise weekly, including weight bearing exercise. Encouraged the use of seatbelts and sunscreen.  Return in 1 year for annual  or sooner prn.  Rubbie Battiest B WHNP-BC, 3:22 PM 06/10/2022

## 2022-06-11 LAB — THYROID PANEL WITH TSH
Free Thyroxine Index: 2.7 (ref 1.4–3.8)
T3 Uptake: 27 % (ref 22–35)
T4, Total: 10 ug/dL (ref 5.1–11.9)
TSH: 2.76 mIU/L

## 2022-06-11 LAB — CYTOLOGY - PAP
Comment: NEGATIVE
Diagnosis: NEGATIVE
High risk HPV: NEGATIVE

## 2022-06-11 LAB — VITAMIN D 25 HYDROXY (VIT D DEFICIENCY, FRACTURES): Vit D, 25-Hydroxy: 29 ng/mL — ABNORMAL LOW (ref 30–100)

## 2022-08-01 IMAGING — MG MM DIGITAL SCREENING BILAT W/ TOMO AND CAD
8 series · 8 of 24 positions shown · non-contrast
Comparison: Previous exam(s).

CLINICAL DATA: Screening.

EXAM:
DIGITAL SCREENING BILATERAL MAMMOGRAM WITH TOMOSYNTHESIS AND CAD
TECHNIQUE: Bilateral screening digital craniocaudal and mediolateral oblique
mammograms were obtained. Bilateral screening digital breast
tomosynthesis was performed. The images were evaluated with
computer-aided detection.

[L MLO synth-2D]
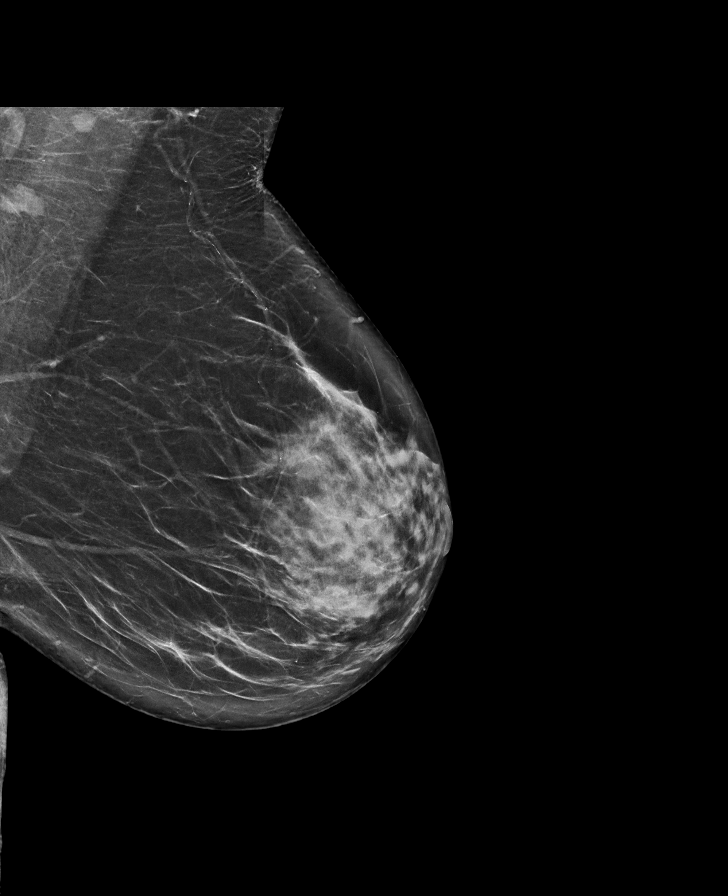

[R MLO synth-2D]
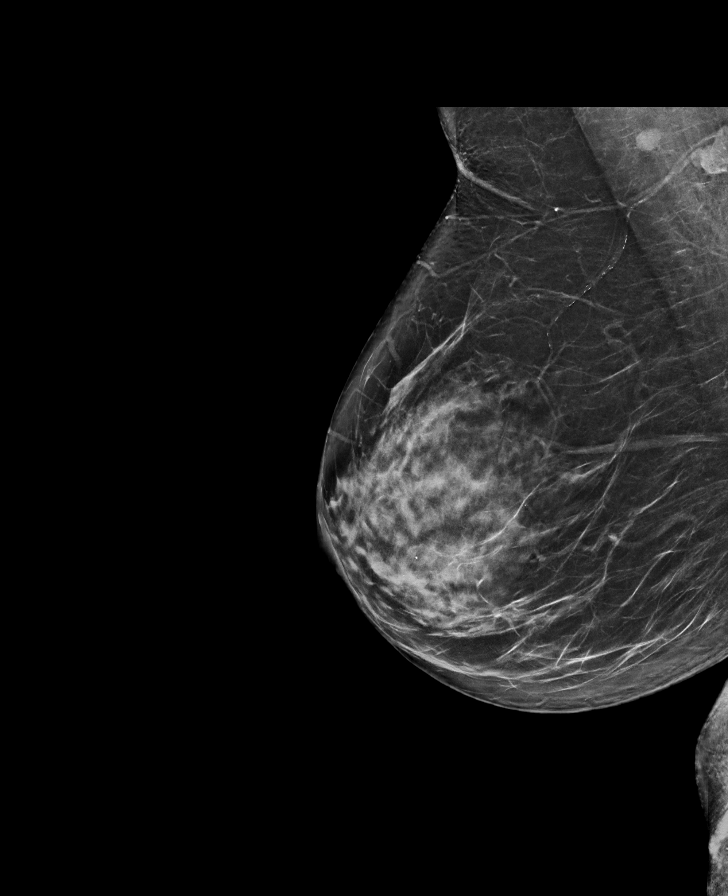

[L CC synth-2D]
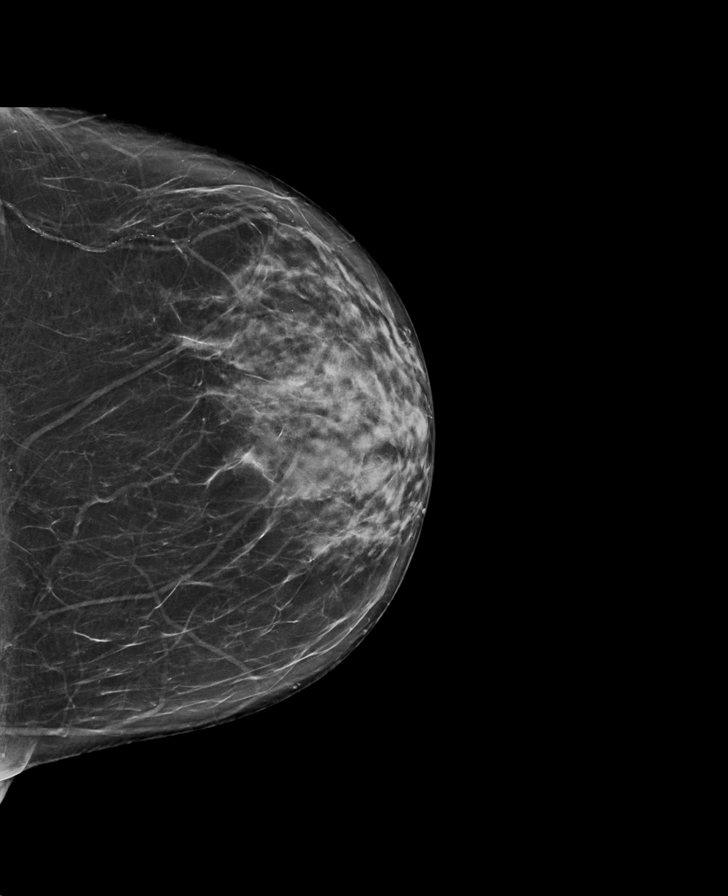

[R CC synth-2D]
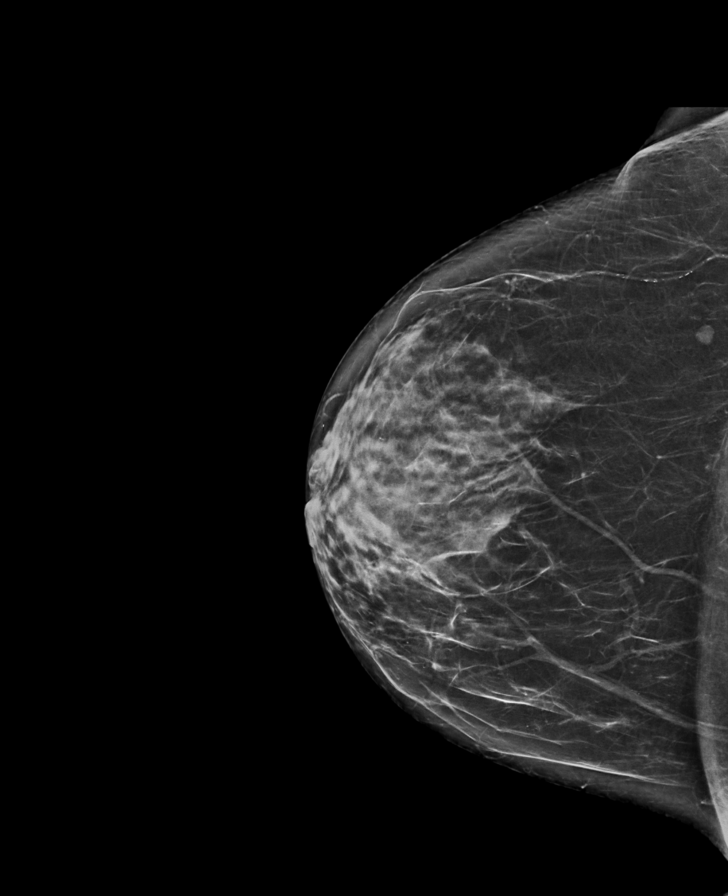

[L CC tomo · tomo slice 39/76.0]
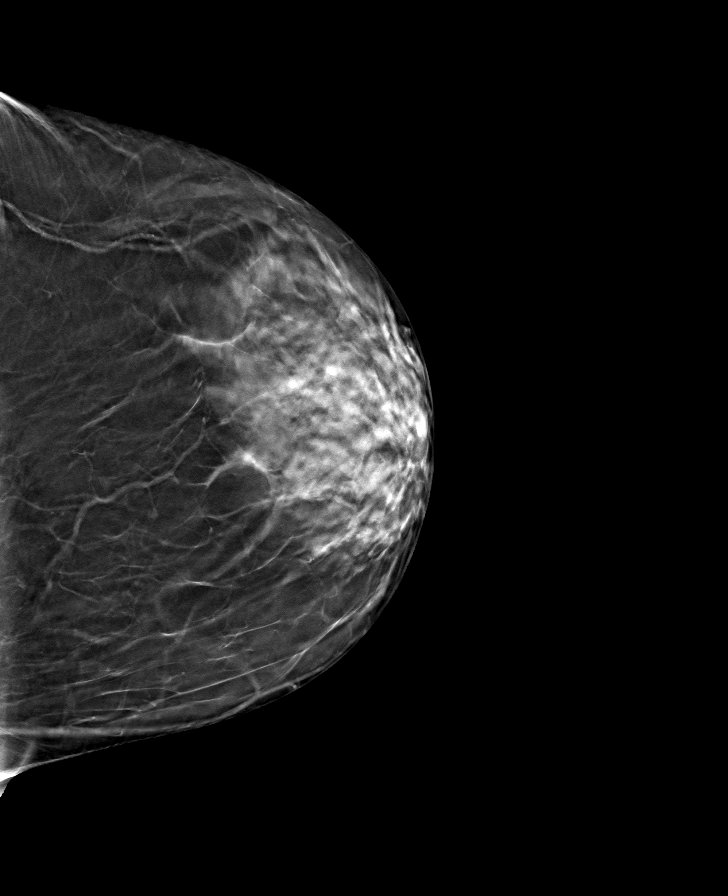

[L MLO tomo · tomo slice 45/88.0]
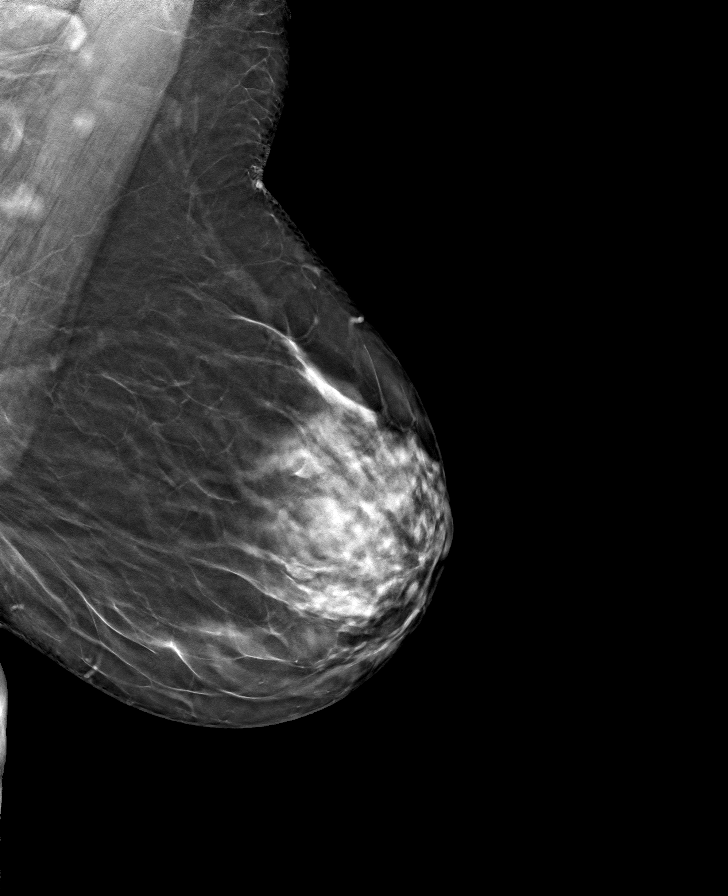

[R MLO tomo · tomo slice 41/82.0]
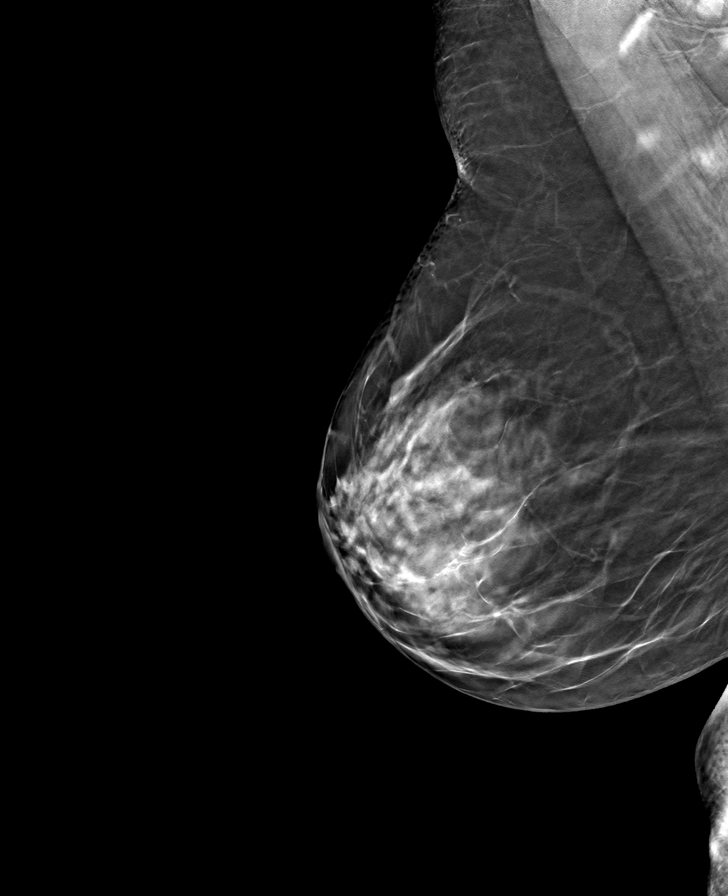

[R CC tomo · tomo slice 39/76.0]
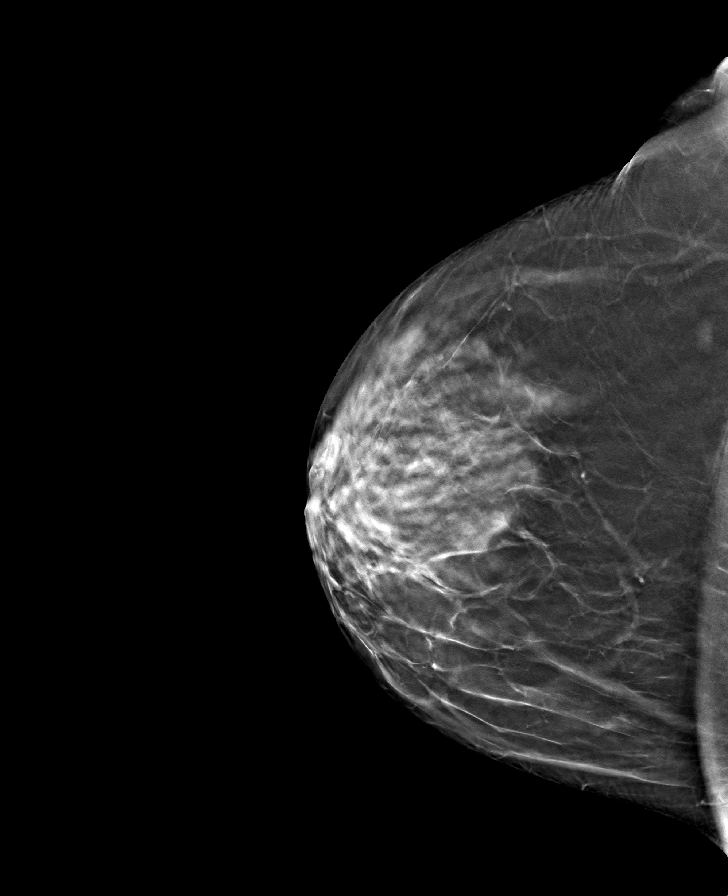

[8 of 24 positions shown; findings below may reference images not displayed]

ACR Breast Density Category c: The breast tissue is heterogeneously
dense, which may obscure small masses.
FINDINGS: There are no findings suspicious for malignancy.
IMPRESSION: No mammographic evidence of malignancy. A result letter of this
screening mammogram will be mailed directly to the patient.

RECOMMENDATION:
Screening mammogram in one year. (Code:Q3-W-BC3)

BI-RADS CATEGORY  1: Negative.

## 2022-12-23 DIAGNOSIS — M9903 Segmental and somatic dysfunction of lumbar region: Secondary | ICD-10-CM | POA: Diagnosis not present

## 2022-12-23 DIAGNOSIS — M9905 Segmental and somatic dysfunction of pelvic region: Secondary | ICD-10-CM | POA: Diagnosis not present

## 2022-12-23 DIAGNOSIS — M5431 Sciatica, right side: Secondary | ICD-10-CM | POA: Diagnosis not present

## 2022-12-23 DIAGNOSIS — M9904 Segmental and somatic dysfunction of sacral region: Secondary | ICD-10-CM | POA: Diagnosis not present

## 2022-12-24 DIAGNOSIS — M9905 Segmental and somatic dysfunction of pelvic region: Secondary | ICD-10-CM | POA: Diagnosis not present

## 2022-12-24 DIAGNOSIS — M9904 Segmental and somatic dysfunction of sacral region: Secondary | ICD-10-CM | POA: Diagnosis not present

## 2022-12-24 DIAGNOSIS — M5431 Sciatica, right side: Secondary | ICD-10-CM | POA: Diagnosis not present

## 2022-12-24 DIAGNOSIS — M9903 Segmental and somatic dysfunction of lumbar region: Secondary | ICD-10-CM | POA: Diagnosis not present

## 2022-12-25 DIAGNOSIS — M9903 Segmental and somatic dysfunction of lumbar region: Secondary | ICD-10-CM | POA: Diagnosis not present

## 2022-12-25 DIAGNOSIS — M9904 Segmental and somatic dysfunction of sacral region: Secondary | ICD-10-CM | POA: Diagnosis not present

## 2022-12-25 DIAGNOSIS — M9905 Segmental and somatic dysfunction of pelvic region: Secondary | ICD-10-CM | POA: Diagnosis not present

## 2022-12-25 DIAGNOSIS — M5431 Sciatica, right side: Secondary | ICD-10-CM | POA: Diagnosis not present

## 2022-12-29 DIAGNOSIS — M5431 Sciatica, right side: Secondary | ICD-10-CM | POA: Diagnosis not present

## 2022-12-29 DIAGNOSIS — M9903 Segmental and somatic dysfunction of lumbar region: Secondary | ICD-10-CM | POA: Diagnosis not present

## 2022-12-29 DIAGNOSIS — M9904 Segmental and somatic dysfunction of sacral region: Secondary | ICD-10-CM | POA: Diagnosis not present

## 2022-12-29 DIAGNOSIS — M9905 Segmental and somatic dysfunction of pelvic region: Secondary | ICD-10-CM | POA: Diagnosis not present

## 2022-12-30 DIAGNOSIS — M9904 Segmental and somatic dysfunction of sacral region: Secondary | ICD-10-CM | POA: Diagnosis not present

## 2022-12-30 DIAGNOSIS — M5431 Sciatica, right side: Secondary | ICD-10-CM | POA: Diagnosis not present

## 2022-12-30 DIAGNOSIS — M9903 Segmental and somatic dysfunction of lumbar region: Secondary | ICD-10-CM | POA: Diagnosis not present

## 2022-12-30 DIAGNOSIS — M9905 Segmental and somatic dysfunction of pelvic region: Secondary | ICD-10-CM | POA: Diagnosis not present

## 2023-01-01 DIAGNOSIS — M9904 Segmental and somatic dysfunction of sacral region: Secondary | ICD-10-CM | POA: Diagnosis not present

## 2023-01-01 DIAGNOSIS — M9903 Segmental and somatic dysfunction of lumbar region: Secondary | ICD-10-CM | POA: Diagnosis not present

## 2023-01-01 DIAGNOSIS — M9905 Segmental and somatic dysfunction of pelvic region: Secondary | ICD-10-CM | POA: Diagnosis not present

## 2023-01-01 DIAGNOSIS — M5431 Sciatica, right side: Secondary | ICD-10-CM | POA: Diagnosis not present

## 2023-01-07 DIAGNOSIS — M5431 Sciatica, right side: Secondary | ICD-10-CM | POA: Diagnosis not present

## 2023-01-07 DIAGNOSIS — M9905 Segmental and somatic dysfunction of pelvic region: Secondary | ICD-10-CM | POA: Diagnosis not present

## 2023-01-07 DIAGNOSIS — M9903 Segmental and somatic dysfunction of lumbar region: Secondary | ICD-10-CM | POA: Diagnosis not present

## 2023-01-07 DIAGNOSIS — M9904 Segmental and somatic dysfunction of sacral region: Secondary | ICD-10-CM | POA: Diagnosis not present

## 2023-01-12 DIAGNOSIS — M9903 Segmental and somatic dysfunction of lumbar region: Secondary | ICD-10-CM | POA: Diagnosis not present

## 2023-01-12 DIAGNOSIS — M9904 Segmental and somatic dysfunction of sacral region: Secondary | ICD-10-CM | POA: Diagnosis not present

## 2023-01-12 DIAGNOSIS — M5431 Sciatica, right side: Secondary | ICD-10-CM | POA: Diagnosis not present

## 2023-01-12 DIAGNOSIS — M9905 Segmental and somatic dysfunction of pelvic region: Secondary | ICD-10-CM | POA: Diagnosis not present

## 2023-01-14 DIAGNOSIS — M5431 Sciatica, right side: Secondary | ICD-10-CM | POA: Diagnosis not present

## 2023-01-14 DIAGNOSIS — M9905 Segmental and somatic dysfunction of pelvic region: Secondary | ICD-10-CM | POA: Diagnosis not present

## 2023-01-14 DIAGNOSIS — M9904 Segmental and somatic dysfunction of sacral region: Secondary | ICD-10-CM | POA: Diagnosis not present

## 2023-01-14 DIAGNOSIS — M9903 Segmental and somatic dysfunction of lumbar region: Secondary | ICD-10-CM | POA: Diagnosis not present

## 2023-01-19 DIAGNOSIS — M9904 Segmental and somatic dysfunction of sacral region: Secondary | ICD-10-CM | POA: Diagnosis not present

## 2023-01-19 DIAGNOSIS — M5431 Sciatica, right side: Secondary | ICD-10-CM | POA: Diagnosis not present

## 2023-01-19 DIAGNOSIS — M9903 Segmental and somatic dysfunction of lumbar region: Secondary | ICD-10-CM | POA: Diagnosis not present

## 2023-01-19 DIAGNOSIS — M9905 Segmental and somatic dysfunction of pelvic region: Secondary | ICD-10-CM | POA: Diagnosis not present

## 2023-01-26 DIAGNOSIS — M9905 Segmental and somatic dysfunction of pelvic region: Secondary | ICD-10-CM | POA: Diagnosis not present

## 2023-01-26 DIAGNOSIS — M5431 Sciatica, right side: Secondary | ICD-10-CM | POA: Diagnosis not present

## 2023-01-26 DIAGNOSIS — M9904 Segmental and somatic dysfunction of sacral region: Secondary | ICD-10-CM | POA: Diagnosis not present

## 2023-01-26 DIAGNOSIS — M9903 Segmental and somatic dysfunction of lumbar region: Secondary | ICD-10-CM | POA: Diagnosis not present

## 2023-01-28 DIAGNOSIS — M9904 Segmental and somatic dysfunction of sacral region: Secondary | ICD-10-CM | POA: Diagnosis not present

## 2023-01-28 DIAGNOSIS — M9903 Segmental and somatic dysfunction of lumbar region: Secondary | ICD-10-CM | POA: Diagnosis not present

## 2023-01-28 DIAGNOSIS — M9905 Segmental and somatic dysfunction of pelvic region: Secondary | ICD-10-CM | POA: Diagnosis not present

## 2023-01-28 DIAGNOSIS — M5431 Sciatica, right side: Secondary | ICD-10-CM | POA: Diagnosis not present

## 2023-02-11 DIAGNOSIS — M9904 Segmental and somatic dysfunction of sacral region: Secondary | ICD-10-CM | POA: Diagnosis not present

## 2023-02-11 DIAGNOSIS — M9903 Segmental and somatic dysfunction of lumbar region: Secondary | ICD-10-CM | POA: Diagnosis not present

## 2023-02-11 DIAGNOSIS — M9905 Segmental and somatic dysfunction of pelvic region: Secondary | ICD-10-CM | POA: Diagnosis not present

## 2023-02-11 DIAGNOSIS — M5431 Sciatica, right side: Secondary | ICD-10-CM | POA: Diagnosis not present

## 2023-02-12 DIAGNOSIS — M9904 Segmental and somatic dysfunction of sacral region: Secondary | ICD-10-CM | POA: Diagnosis not present

## 2023-02-12 DIAGNOSIS — M5431 Sciatica, right side: Secondary | ICD-10-CM | POA: Diagnosis not present

## 2023-02-12 DIAGNOSIS — M9903 Segmental and somatic dysfunction of lumbar region: Secondary | ICD-10-CM | POA: Diagnosis not present

## 2023-02-12 DIAGNOSIS — M9905 Segmental and somatic dysfunction of pelvic region: Secondary | ICD-10-CM | POA: Diagnosis not present

## 2023-02-18 DIAGNOSIS — M9904 Segmental and somatic dysfunction of sacral region: Secondary | ICD-10-CM | POA: Diagnosis not present

## 2023-02-18 DIAGNOSIS — M5431 Sciatica, right side: Secondary | ICD-10-CM | POA: Diagnosis not present

## 2023-02-18 DIAGNOSIS — M9903 Segmental and somatic dysfunction of lumbar region: Secondary | ICD-10-CM | POA: Diagnosis not present

## 2023-02-18 DIAGNOSIS — M9905 Segmental and somatic dysfunction of pelvic region: Secondary | ICD-10-CM | POA: Diagnosis not present

## 2023-02-19 DIAGNOSIS — M5431 Sciatica, right side: Secondary | ICD-10-CM | POA: Diagnosis not present

## 2023-02-19 DIAGNOSIS — M9903 Segmental and somatic dysfunction of lumbar region: Secondary | ICD-10-CM | POA: Diagnosis not present

## 2023-02-19 DIAGNOSIS — M9905 Segmental and somatic dysfunction of pelvic region: Secondary | ICD-10-CM | POA: Diagnosis not present

## 2023-02-19 DIAGNOSIS — M9904 Segmental and somatic dysfunction of sacral region: Secondary | ICD-10-CM | POA: Diagnosis not present

## 2023-02-25 DIAGNOSIS — M9904 Segmental and somatic dysfunction of sacral region: Secondary | ICD-10-CM | POA: Diagnosis not present

## 2023-02-25 DIAGNOSIS — M9905 Segmental and somatic dysfunction of pelvic region: Secondary | ICD-10-CM | POA: Diagnosis not present

## 2023-02-25 DIAGNOSIS — M9903 Segmental and somatic dysfunction of lumbar region: Secondary | ICD-10-CM | POA: Diagnosis not present

## 2023-02-25 DIAGNOSIS — M5431 Sciatica, right side: Secondary | ICD-10-CM | POA: Diagnosis not present

## 2023-03-12 DIAGNOSIS — M5431 Sciatica, right side: Secondary | ICD-10-CM | POA: Diagnosis not present

## 2023-03-12 DIAGNOSIS — M9904 Segmental and somatic dysfunction of sacral region: Secondary | ICD-10-CM | POA: Diagnosis not present

## 2023-03-12 DIAGNOSIS — M9905 Segmental and somatic dysfunction of pelvic region: Secondary | ICD-10-CM | POA: Diagnosis not present

## 2023-03-12 DIAGNOSIS — M9903 Segmental and somatic dysfunction of lumbar region: Secondary | ICD-10-CM | POA: Diagnosis not present

## 2023-03-18 DIAGNOSIS — M9903 Segmental and somatic dysfunction of lumbar region: Secondary | ICD-10-CM | POA: Diagnosis not present

## 2023-03-18 DIAGNOSIS — M5431 Sciatica, right side: Secondary | ICD-10-CM | POA: Diagnosis not present

## 2023-03-18 DIAGNOSIS — M9904 Segmental and somatic dysfunction of sacral region: Secondary | ICD-10-CM | POA: Diagnosis not present

## 2023-03-18 DIAGNOSIS — M9905 Segmental and somatic dysfunction of pelvic region: Secondary | ICD-10-CM | POA: Diagnosis not present

## 2023-03-26 DIAGNOSIS — M9904 Segmental and somatic dysfunction of sacral region: Secondary | ICD-10-CM | POA: Diagnosis not present

## 2023-03-26 DIAGNOSIS — M9905 Segmental and somatic dysfunction of pelvic region: Secondary | ICD-10-CM | POA: Diagnosis not present

## 2023-03-26 DIAGNOSIS — M5431 Sciatica, right side: Secondary | ICD-10-CM | POA: Diagnosis not present

## 2023-03-26 DIAGNOSIS — M9903 Segmental and somatic dysfunction of lumbar region: Secondary | ICD-10-CM | POA: Diagnosis not present

## 2023-04-22 DIAGNOSIS — M9903 Segmental and somatic dysfunction of lumbar region: Secondary | ICD-10-CM | POA: Diagnosis not present

## 2023-04-22 DIAGNOSIS — M9904 Segmental and somatic dysfunction of sacral region: Secondary | ICD-10-CM | POA: Diagnosis not present

## 2023-04-22 DIAGNOSIS — M9905 Segmental and somatic dysfunction of pelvic region: Secondary | ICD-10-CM | POA: Diagnosis not present

## 2023-04-22 DIAGNOSIS — M5431 Sciatica, right side: Secondary | ICD-10-CM | POA: Diagnosis not present

## 2023-12-24 ENCOUNTER — Other Ambulatory Visit: Payer: Self-pay | Admitting: Radiology

## 2023-12-24 DIAGNOSIS — Z1231 Encounter for screening mammogram for malignant neoplasm of breast: Secondary | ICD-10-CM

## 2024-01-01 DIAGNOSIS — Z1231 Encounter for screening mammogram for malignant neoplasm of breast: Secondary | ICD-10-CM

## 2024-01-05 ENCOUNTER — Ambulatory Visit
Admission: RE | Admit: 2024-01-05 | Discharge: 2024-01-05 | Disposition: A | Discharge status: Home / Self Care | Payer: BC Managed Care – PPO | Source: Ambulatory Visit | Attending: Radiology | Admitting: Radiology

## 2024-01-05 DIAGNOSIS — Z1231 Encounter for screening mammogram for malignant neoplasm of breast: Secondary | ICD-10-CM

## 2024-03-07 DIAGNOSIS — D1722 Benign lipomatous neoplasm of skin and subcutaneous tissue of left arm: Secondary | ICD-10-CM | POA: Diagnosis not present

## 2024-03-07 DIAGNOSIS — C44719 Basal cell carcinoma of skin of left lower limb, including hip: Secondary | ICD-10-CM | POA: Diagnosis not present

## 2024-03-07 DIAGNOSIS — L814 Other melanin hyperpigmentation: Secondary | ICD-10-CM | POA: Diagnosis not present

## 2024-03-07 DIAGNOSIS — D1723 Benign lipomatous neoplasm of skin and subcutaneous tissue of right leg: Secondary | ICD-10-CM | POA: Diagnosis not present

## 2024-03-07 DIAGNOSIS — L821 Other seborrheic keratosis: Secondary | ICD-10-CM | POA: Diagnosis not present

## 2024-04-20 DIAGNOSIS — C44719 Basal cell carcinoma of skin of left lower limb, including hip: Secondary | ICD-10-CM | POA: Diagnosis not present

## 2024-07-08 ENCOUNTER — Ambulatory Visit (INDEPENDENT_AMBULATORY_CARE_PROVIDER_SITE_OTHER): Admitting: Radiology

## 2024-07-08 ENCOUNTER — Encounter: Payer: Self-pay | Admitting: Radiology

## 2024-07-08 ENCOUNTER — Encounter: Payer: Self-pay | Admitting: Gastroenterology

## 2024-07-08 VITALS — BP 138/68 | HR 91 | Ht 63.19 in | Wt 160.4 lb

## 2024-07-08 DIAGNOSIS — Z01419 Encounter for gynecological examination (general) (routine) without abnormal findings: Secondary | ICD-10-CM | POA: Diagnosis not present

## 2024-07-08 DIAGNOSIS — N951 Menopausal and female climacteric states: Secondary | ICD-10-CM | POA: Diagnosis not present

## 2024-07-08 DIAGNOSIS — Z1211 Encounter for screening for malignant neoplasm of colon: Secondary | ICD-10-CM

## 2024-07-08 DIAGNOSIS — Z1331 Encounter for screening for depression: Secondary | ICD-10-CM | POA: Diagnosis not present

## 2024-07-08 MED ORDER — PROGESTERONE MICRONIZED 100 MG PO CAPS: 100.0000 mg | ORAL_CAPSULE | Freq: Every evening | ORAL | 1 refills | Status: DC

## 2024-07-08 MED ORDER — ESTRADIOL 0.0375 MG/24HR TD PTTW: 1.0000 | MEDICATED_PATCH | TRANSDERMAL | 1 refills | Status: DC

## 2024-07-08 NOTE — Progress Notes (Signed)
 ZABDI MIS 1966-11-23 [id number]   History:  58 y.o. G3P3 presents for annual exam. C/o menopausal symptoms, brain fog, fatigue, weight gain, bilateral hip pain. 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 Needs referral for colonoscopy, did not complete cologuard.  Gynecologic History No LMP recorded. Patient has had an ablation.   Contraception/Family planning: post menopausal status Sexually active: no Last Pap: 6/23. Results were: normal Last mammogram: 01/05/24. Results were: normal  Obstetric History OB History  Gravida Para Term Preterm AB Living  3    0 3  SAB IAB Ectopic Multiple Live Births    0      # Outcome Date GA Lbr Len/2nd Weight Sex Type Anes PTL Lv  3 Gravida           2 Gravida           1 Gravida                07/08/2024   10:24 AM  Depression screen PHQ 2/9  Decreased Interest 0  Down, Depressed, Hopeless 0  PHQ - 2 Score 0     The following portions of the patient's history were reviewed and updated as appropriate: allergies, current medications, past family history, past medical history, past social history, past surgical history, and problem list.  Review of Systems  All other systems reviewed and are negative.   Past medical history, past surgical history, family history and social history were all reviewed and documented in the EPIC chart.  Exam:  Vitals:   07/08/24 1017  BP: 138/68  Pulse: 91  SpO2: 98%  Weight: 160 lb 6.4 oz (72.8 kg)  Height: 5' 3.19 (1.605 m)   Body mass index is 28.24 kg/m.  Physical Exam Vitals and nursing note reviewed. Exam conducted with a chaperone present.  Constitutional:      Appearance: Normal appearance. She is normal weight.  HENT:     Head: Normocephalic and atraumatic.  Neck:     Thyroid : No thyroid  mass, thyromegaly or thyroid  tenderness.  Cardiovascular:     Rate and Rhythm: Regular rhythm.     Heart sounds: Normal heart sounds.  Pulmonary:     Effort: Pulmonary effort is normal.     Breath sounds: Normal  breath sounds.  Chest:  Breasts:    Breasts are symmetrical.     Right: Normal. No inverted nipple, mass, nipple discharge, skin change or tenderness.     Left: Normal. No inverted nipple, mass, nipple discharge, skin change or tenderness.  Abdominal:     General: Abdomen is flat. Bowel sounds are normal.     Palpations: Abdomen is soft.  Genitourinary:    General: Normal vulva.     Vagina: Normal. No vaginal discharge, bleeding or lesions.     Cervix: Normal. No discharge or lesion.     Uterus: Normal. Not enlarged and not tender.      Adnexa: Right adnexa normal and left adnexa normal.       Right: No mass, tenderness or fullness.         Left: No mass, tenderness or fullness.    Lymphadenopathy:     Upper Body:     Right upper body: No axillary adenopathy.     Left upper body: No axillary adenopathy.  Skin:    General: Skin is warm and dry.  Neurological:     Mental Status: She is alert and oriented to person, place, and time.  Psychiatric:        Mood  and Affect: Mood normal.        Thought Content: Thought content normal.        Judgment: Judgment normal.      Darice Hoit, CMA present for exam  Assessment/Plan:   1. Well woman exam with routine gynecological exam (Primary) Pap 2026  2. Menopausal symptoms - progesterone (PROMETRIUM) 100 MG capsule; Take 1 capsule (100 mg total) by mouth at bedtime.  Dispense: 90 capsule; Refill: 1 - estradiol (VIVELLE-DOT) 0.0375 MG/24HR; Place 1 patch onto the skin 2 (two) times a week.  Dispense: 24 patch; Refill: 1  3. Depression screening negative  4. Screen for colon cancer - Ambulatory referral to Gastroenterology   Discussed SBE, colonoscopy and DEXA screening as directed/appropriate. Recommend of exercise weekly, including weight bearing exercise. Encouraged the use of seatbelts and sunscreen.  Return in about 3 months (around 10/08/2024) for Med Follow-up.  GINETTE COZIER B WHNP-BC 11:06 AM 07/08/2024

## 2024-07-08 NOTE — Patient Instructions (Signed)
 Preventive Care 16-58 Years Old, Female  Preventive care refers to lifestyle choices and visits with your health care provider that can promote health and wellness. Preventive care visits are also called wellness exams.  What can I expect for my preventive care visit?  Counseling  Your health care provider may ask you questions about your:  Medical history, including:  Past medical problems.  Family medical history.  Pregnancy history.  Current health, including:  Menstrual cycle.  Method of birth control.  Emotional well-being.  Home life and relationship well-being.  Sexual activity and sexual health.  Lifestyle, including:  Alcohol, nicotine or tobacco, and drug use.  Access to firearms.  Diet, exercise, and sleep habits.  Work and work Astronomer.  Sunscreen use.  Safety issues such as seatbelt and bike helmet use.  Physical exam  Your health care provider will check your:  Height and weight. These may be used to calculate your BMI (body mass index). BMI is a measurement that tells if you are at a healthy weight.  Waist circumference. This measures the distance around your waistline. This measurement also tells if you are at a healthy weight and may help predict your risk of certain diseases, such as type 2 diabetes and high blood pressure.  Heart rate and blood pressure.  Body temperature.  Skin for abnormal spots.  What immunizations do I need?    Vaccines are usually given at various ages, according to a schedule. Your health care provider will recommend vaccines for you based on your age, medical history, and lifestyle or other factors, such as travel or where you work.  What tests do I need?  Screening  Your health care provider may recommend screening tests for certain conditions. This may include:  Lipid and cholesterol levels.  Diabetes screening. This is done by checking your blood sugar (glucose) after you have not eaten for a while (fasting).  Pelvic exam and Pap test.  Hepatitis B test.  Hepatitis C  test.  HIV (human immunodeficiency virus) test.  STI (sexually transmitted infection) testing, if you are at risk.  Lung cancer screening.  Colorectal cancer screening.  Mammogram. Talk with your health care provider about when you should start having regular mammograms. This may depend on whether you have a family history of breast cancer.  BRCA-related cancer screening. This may be done if you have a family history of breast, ovarian, tubal, or peritoneal cancers.  Bone density scan. This is done to screen for osteoporosis.  Talk with your health care provider about your test results, treatment options, and if necessary, the need for more tests.  Follow these instructions at home:  Eating and drinking    Eat a diet that includes fresh fruits and vegetables, whole grains, lean protein, and low-fat dairy products.  Take vitamin and mineral supplements as recommended by your health care provider.  Do not drink alcohol if:  Your health care provider tells you not to drink.  You are pregnant, may be pregnant, or are planning to become pregnant.  If you drink alcohol:  Limit how much you have to 0-1 drink a day.  Know how much alcohol is in your drink. In the [place] one drink equals one 12 oz bottle of beer (355 mL), one 5 oz glass of wine (148 mL), or one 1 oz glass of hard liquor (44 mL).  Lifestyle  Brush your teeth every morning and night with fluoride toothpaste. Floss one time each day.  Exercise for at least  30 minutes 5 or more days each week.  Do not use any products that contain nicotine or tobacco. These products include cigarettes, chewing tobacco, and vaping devices, such as e-cigarettes. If you need help quitting, ask your health care provider.  Do not use drugs.  If you are sexually active, practice safe sex. Use a condom or other form of protection to prevent STIs.  If you do not wish to become pregnant, use a form of birth control. If you plan to become pregnant, see your health care provider for a  prepregnancy visit.  Take aspirin only as told by your health care provider. Make sure that you understand how much to take and what form to take. Work with your health care provider to find out whether it is safe and beneficial for you to take aspirin daily.  Find healthy ways to manage stress, such as:  Meditation, yoga, or listening to music.  Journaling.  Talking to a trusted person.  Spending time with friends and family.  Minimize exposure to UV radiation to reduce your risk of skin cancer.  Safety  Always wear your seat belt while driving or riding in a vehicle.  Do not drive:  If you have been drinking alcohol. Do not ride with someone who has been drinking.  When you are tired or distracted.  While texting.  If you have been using any mind-altering substances or drugs.  Wear a helmet and other protective equipment during sports activities.  If you have firearms in your house, make sure you follow all gun safety procedures.  Seek help if you have been physically or sexually abused.  What's next?  Visit your health care provider once a year for an annual wellness visit.  Ask your health care provider how often you should have your eyes and teeth checked.  Stay up to date on all vaccines.  This information is not intended to replace advice given to you by your health care provider. Make sure you discuss any questions you have with your health care provider.  Document Revised: 06/05/2021 Document Reviewed: 06/05/2021  Elsevier Patient Education  2024 [employer].

## 2024-08-02 ENCOUNTER — Ambulatory Visit (AMBULATORY_SURGERY_CENTER)

## 2024-08-02 ENCOUNTER — Encounter: Payer: Self-pay | Admitting: Gastroenterology

## 2024-08-02 VITALS — Ht 63.0 in | Wt 155.0 lb

## 2024-08-02 DIAGNOSIS — Z1211 Encounter for screening for malignant neoplasm of colon: Secondary | ICD-10-CM

## 2024-08-02 MED ORDER — NA SULFATE-K SULFATE-MG SULF 17.5-3.13-1.6 GM/177ML PO SOLN: 1.0000 | Freq: Once | ORAL | 0 refills | Status: AC

## 2024-08-02 NOTE — Progress Notes (Signed)

## 2024-08-26 ENCOUNTER — Encounter: Payer: Self-pay | Admitting: Gastroenterology

## 2024-08-26 ENCOUNTER — Ambulatory Visit: Admitting: Gastroenterology

## 2024-08-26 VITALS — BP 115/74 | HR 77 | Temp 97.7°F | Resp 12 | Ht 63.0 in | Wt 155.0 lb

## 2024-08-26 DIAGNOSIS — K640 First degree hemorrhoids: Secondary | ICD-10-CM

## 2024-08-26 DIAGNOSIS — K5730 Diverticulosis of large intestine without perforation or abscess without bleeding: Secondary | ICD-10-CM | POA: Diagnosis not present

## 2024-08-26 DIAGNOSIS — Z1211 Encounter for screening for malignant neoplasm of colon: Secondary | ICD-10-CM | POA: Diagnosis not present

## 2024-08-26 DIAGNOSIS — K529 Noninfective gastroenteritis and colitis, unspecified: Secondary | ICD-10-CM | POA: Diagnosis not present

## 2024-08-26 MED ORDER — SODIUM CHLORIDE 0.9 % IV SOLN: 500.0000 mL | Freq: Once | INTRAVENOUS | Status: DC

## 2024-08-26 NOTE — Op Note (Signed)
 [facility] Patient Name: Julia Dunlap Procedure Date: 08/26/2024 10:10 AM MRN: [id number] Endoscopist: Sandor Flatter , MD, [id number] Age: 58 Referring MD:  Date of Birth: 03-20-1966 Gender: Female Account #: [id number] Procedure:                Colonoscopy Indications:              Screening for colorectal malignant neoplasm, This                            is the patient's first colonoscopy Medicines:                Monitored Anesthesia Care Procedure:                Pre-Anesthesia Assessment:                           - Prior to the procedure, a History and Physical                            was performed, and patient medications and                            allergies were reviewed. The patient's tolerance of                            previous anesthesia was also reviewed. The risks                            and benefits of the procedure and the sedation                            options and risks were discussed with the patient.                            All questions were answered, and informed consent                            was obtained. Prior Anticoagulants: The patient has                            taken no anticoagulant or antiplatelet agents. ASA                            Grade Assessment: II - A patient with mild systemic                            disease. After reviewing the risks and benefits,                            the patient was deemed in satisfactory condition to                            undergo the procedure.  After obtaining informed consent, the colonoscope                            was passed under direct vision. Throughout the                            procedure, the patient's blood pressure, pulse, and                            oxygen saturations were monitored continuously. The                            CF [id number] #[id number] was introduced through the anus                            and advanced to the the  terminal ileum. The                            colonoscopy was performed without difficulty. The                            patient tolerated the procedure well. The quality                            of the bowel preparation was good. The terminal                            ileum, ileocecal valve, appendiceal orifice, and                            rectum were photographed. Scope In: 10:29:08 AM Scope Out: 10:45:27 AM Scope Withdrawal Time: 0 hours 12 minutes 15 seconds  Total Procedure Duration: 0 hours 16 minutes 19 seconds  Findings:                 The perianal and digital rectal examinations were                            normal.                           Localized mild inflammation characterized by                            congestion (edema) and erythema was found in the                            proximal ascending colon. Biopsies were taken with                            a cold forceps for histology. Estimated blood loss                            was minimal.  Multiple medium-mouthed and small-mouthed                            diverticula were found in the entire colon.                           Non-bleeding internal hemorrhoids were found during                            retroflexion. The hemorrhoids were small.                           The terminal ileum appeared normal. Complications:            No immediate complications. Estimated Blood Loss:     Estimated blood loss was minimal. Impression:               - Localized mild inflammation was found in the                            proximal ascending colon. Biopsied.                           - Diverticulosis in the entire examined colon.                           - The remainder of the colon was otherwise normal                            appearing.                           - Non-bleeding internal hemorrhoids.                           - The examined portion of the ileum was  normal. Recommendation:           - Patient has a contact number available for                            emergencies. The signs and symptoms of potential                            delayed complications were discussed with the                            patient. Return to normal activities tomorrow.                            Written discharge instructions were provided to the                            patient.                           - Resume previous diet.                           -  Await pathology results.                           - Repeat colonoscopy in 10 years for screening                            purposes.                           - Return to GI office PRN. Sandor Flatter, MD 08/26/2024 10:50:24 AM

## 2024-08-26 NOTE — Patient Instructions (Addendum)
    Handouts on diverticulosis & hemorrhoids given to you today.   Await pathology results fo colon biopsies done   Continue previous diet & medications    YOU HAD AN ENDOSCOPIC PROCEDURE TODAY AT THE [facility]:   Refer to the procedure report that was given to you for any specific questions about what was found during the examination.  If the procedure report does not answer your questions, please call your gastroenterologist to clarify.  If you requested that your care partner not be given the details of your procedure findings, then the procedure report has been included in a sealed envelope for you to review at your convenience later.  YOU SHOULD EXPECT: Some feelings of bloating in the abdomen. Passage of more gas than usual.  Walking can help get rid of the air that was put into your GI tract during the procedure and reduce the bloating. If you had a lower endoscopy (such as a colonoscopy or flexible sigmoidoscopy) you may notice spotting of blood in your stool or on the toilet paper. If you underwent a bowel prep for your procedure, you may not have a normal bowel movement for a few days.  Please Note:  You might notice some irritation and congestion in your nose or some drainage.  This is from the oxygen used during your procedure.  There is no need for concern and it should clear up in a day or so.  SYMPTOMS TO REPORT IMMEDIATELY:  Following lower endoscopy (colonoscopy or flexible sigmoidoscopy):  Excessive amounts of blood in the stool  Significant tenderness or worsening of abdominal pains  Swelling of the abdomen that is new, acute  Fever of 100F or higher   For urgent or emergent issues, a gastroenterologist can be reached at any hour by calling [phone]. Do not use MyChart messaging for urgent concerns.    DIET:  We do recommend a small meal at first, but then you may proceed to your regular diet.  Drink plenty of fluids but you should avoid alcoholic  beverages for 24 hours.  ACTIVITY:  You should plan to take it easy for the rest of today and you should NOT DRIVE or use heavy machinery until tomorrow (because of the sedation medicines used during the test).    FOLLOW UP: Our staff will call the number listed on your records the next business day following your procedure.  We will call around 7:15- 8:00 am to check on you and address any questions or concerns that you may have regarding the information given to you following your procedure. If we do not reach you, we will leave a message.     If any biopsies were taken you will be contacted by phone or by letter within the next 1-3 weeks.  Please call us  at [phone] if you have not heard about the biopsies in 3 weeks.    SIGNATURES/CONFIDENTIALITY: You and/or your care partner have signed paperwork which will be entered into your electronic medical record.  These signatures attest to the fact that that the information above on your After Visit Summary has been reviewed and is understood.  Full responsibility of the confidentiality of this discharge information lies with you and/or your care-partner.

## 2024-08-26 NOTE — Progress Notes (Signed)
 Report to PACU, RN, vss, BBS= Clear.

## 2024-08-26 NOTE — Progress Notes (Signed)
 Pt's states no medical or surgical changes since previsit or office visit.

## 2024-08-26 NOTE — Progress Notes (Signed)
 GASTROENTEROLOGY PROCEDURE H&P NOTE   Primary Care Physician: No primary care provider on file.    Reason for Procedure:  Colon Cancer screening  Plan:    Colonoscopy  Patient is appropriate for endoscopic procedure(s) in the ambulatory ([facility]) setting.  The nature of the procedure, as well as the risks, benefits, and alternatives were carefully and thoroughly reviewed with the patient. Ample time for discussion and questions allowed. The patient understood, was satisfied, and agreed to proceed.     HPI: Julia Dunlap is a 58 y.o. female who presents for colonoscopy for routine Colon Cancer screening.  No active GI symptoms.  No known family history of colon cancer or related malignancy.  Patient is otherwise without complaints or active issues today.  Past Medical History:  Diagnosis Date   Skin cancer    on left leg    Past Surgical History:  Procedure Laterality Date   APPENDECTOMY     CESAREAN SECTION     SKIN CANCER EXCISION Left    leg    Prior to Admission medications   Medication Sig Start Date End Date Taking? Authorizing Provider  Ascorbic Acid (VITAMIN C PO) Take 1 tablet by mouth daily.   Yes [provider]  estradiol  (VIVELLE -DOT) 0.0375 MG/24HR Place 1 patch onto the skin 2 (two) times a week. 07/11/24  Yes Chrzanowski, Jami B, NP  Multiple Vitamin (MULTIVITAMIN) capsule Take 1 capsule by mouth daily.   Yes [provider]  Omega-3 Fatty Acids (FISH OIL PO) Take 1 tablet by mouth daily.   Yes [provider]  progesterone  (PROMETRIUM ) 100 MG capsule Take 1 capsule (100 mg total) by mouth at bedtime. 07/08/24  Yes Chrzanowski, Jami B, NP  VITAMIN D  PO Take 1,000 Int'l Units/1.7m2 by mouth.   Yes [provider]    Current Outpatient Medications  Medication Sig Dispense Refill   Ascorbic Acid (VITAMIN C PO) Take 1 tablet by mouth daily.     estradiol  (VIVELLE -DOT) 0.0375 MG/24HR Place 1 patch onto the skin 2 (two) times a  week. 24 patch 1   Multiple Vitamin (MULTIVITAMIN) capsule Take 1 capsule by mouth daily.     Omega-3 Fatty Acids (FISH OIL PO) Take 1 tablet by mouth daily.     progesterone  (PROMETRIUM ) 100 MG capsule Take 1 capsule (100 mg total) by mouth at bedtime. 90 capsule 1   VITAMIN D  PO Take 1,000 Int'l Units/1.7m2 by mouth.     Current Facility-Administered Medications  Medication Dose Route Frequency Provider Last Rate Last Admin   0.9 %  sodium chloride  infusion  500 mL Intravenous Once Windy Dudek V, DO        Allergies as of 08/26/2024   (No Known Allergies)    Family History  Problem Relation Age of Onset   Aortic stenosis Mother    Hypertension Mother    Thyroid  disease Mother    COPD Father    Emphysema Father    Hypertension Father    Breast cancer Sister 86   Diabetes Maternal Grandmother    Cancer Maternal Grandfather    Alzheimer's disease Paternal Grandmother    COPD Paternal Grandfather    Emphysema Paternal Grandfather    Colon cancer Neg Hx    Rectal cancer Neg Hx    Stomach cancer Neg Hx    Esophageal cancer Neg Hx     Social History   Socioeconomic History   Marital status: Divorced    Spouse name: Not on file  Number of children: Not on file   Years of education: Not on file   Highest education level: Not on file  Occupational History   Not on file  Tobacco Use   Smoking status: Former    Current packs/day: 0.00    Average packs/day: 1 pack/day for 30.0 years (30.0 ttl pk-yrs)    Types: Cigarettes    Start date: 07/21/1988    Quit date: 07/21/2018    Years since quitting: 6.1   Smokeless tobacco: Never  Vaping Use   Vaping status: Former  Substance and Sexual Activity   Alcohol  use: No    Comment: no ETOH use x's 9 years   Drug use: No   Sexual activity: Not Currently    Partners: Male    Birth control/protection: Surgical, Post-menopausal  Other Topics Concern   Not on file  Social History Narrative   Not on file   Social Drivers  of Health   Financial Resource Strain: Not on file  Food Insecurity: Not on file  Transportation Needs: Not on file  Physical Activity: Not on file  Stress: Not on file  Social Connections: Not on file  Intimate Partner Violence: Not on file    Physical Exam: Vital signs in last 24 hours: @BP  (!) 143/93   Pulse 83   Temp 97.7 F (36.5 C)   Ht 5' 3 (1.6 m)   Wt 155 lb (70.3 kg)   SpO2 97%   BMI 27.46 kg/m  GEN: NAD EYE: Sclerae anicteric ENT: MMM CV: Non-tachycardic Pulm: CTA b/l GI: Soft, NT/ND NEURO:  Alert & Oriented x 3   Sandor Flatter, DO [facility]   08/26/2024 10:16 AM

## 2024-08-29 ENCOUNTER — Telehealth: Payer: Self-pay | Admitting: Lactation Services

## 2024-08-29 NOTE — Telephone Encounter (Signed)
  Follow up Call-     08/26/2024    9:50 AM  Call back number  Post procedure Call Back phone  # [phone]  Permission to leave phone message Yes     Patient questions:  Do you have a fever, pain , or abdominal swelling? No. Pain Score  0 *  Have you tolerated food without any problems? Yes.    Have you been able to return to your normal activities? Yes.    Do you have any questions about your discharge instructions: Diet   No. Medications  No. Follow up visit  No.  Do you have questions or concerns about your Care? No.  Actions: * If pain score is 4 or above: No action needed, pain <4.

## 2024-08-30 LAB — SURGICAL PATHOLOGY

## 2024-08-31 ENCOUNTER — Ambulatory Visit: Payer: Self-pay | Admitting: Gastroenterology

## 2024-08-31 ENCOUNTER — Other Ambulatory Visit: Payer: Self-pay | Admitting: Radiology

## 2024-08-31 DIAGNOSIS — N951 Menopausal and female climacteric states: Secondary | ICD-10-CM

## 2024-08-31 MED ORDER — BIJUVA 0.5-100 MG PO CAPS: 1.0000 | ORAL_CAPSULE | Freq: Every evening | ORAL | 2 refills | Status: AC

## 2024-10-05 ENCOUNTER — Other Ambulatory Visit: Payer: Self-pay | Admitting: Radiology

## 2024-10-05 DIAGNOSIS — Z13820 Encounter for screening for osteoporosis: Secondary | ICD-10-CM

## 2024-10-05 DIAGNOSIS — Z780 Asymptomatic menopausal state: Secondary | ICD-10-CM

## 2024-10-05 DIAGNOSIS — E2839 Other primary ovarian failure: Secondary | ICD-10-CM

## 2024-10-05 NOTE — Telephone Encounter (Signed)
 Spoke with patient. Locations reviewed for imaging, ordered at [facility]. Number provided to patient for scheduling.   Order pended. Jami -please confirm Dx.

## 2024-10-05 NOTE — Telephone Encounter (Signed)
 No previous BMD on file.  Last AEX 07/08/24 BMD discussed. No orders. Started on HRT, changed to Bijuva  08/31/24.

## 2024-10-05 NOTE — Telephone Encounter (Signed)
 Ok to order

## 2024-10-07 ENCOUNTER — Other Ambulatory Visit: Payer: Self-pay | Admitting: Radiology

## 2024-10-07 ENCOUNTER — Ambulatory Visit: Admitting: Radiology

## 2024-10-07 DIAGNOSIS — G8929 Other chronic pain: Secondary | ICD-10-CM

## 2024-10-07 NOTE — Telephone Encounter (Signed)
 Spoke with patient. Advised per Jami. OV scheduled for 12/07/24 at 0900.   Patient is scheduled for BMD [facility], is going to call Zelda Salmon for earlier appt. Will notify office if new order needed. Patient appreciative of call.   Encounter closed.   Routing referral to [employer]

## 2024-10-07 NOTE — Telephone Encounter (Signed)
 Jami -please review.

## 2024-10-07 NOTE — Telephone Encounter (Signed)
 See MyChart encounter dated 10/07/24.   Encounter closed.

## 2024-10-07 NOTE — Telephone Encounter (Signed)
 I had them reach out to change the appointment because we just changed her to Bijuva  a couple weeks ago and that needs time to begin working. I referred her to an orthopedic hip specialist, Dr Genelle.

## 2024-10-10 ENCOUNTER — Ambulatory Visit (HOSPITAL_COMMUNITY)
Admission: RE | Admit: 2024-10-10 | Discharge: 2024-10-10 | Disposition: A | Discharge status: Home / Self Care | Source: Ambulatory Visit | Attending: Radiology | Admitting: Radiology

## 2024-10-10 DIAGNOSIS — Z13820 Encounter for screening for osteoporosis: Secondary | ICD-10-CM | POA: Insufficient documentation

## 2024-10-10 DIAGNOSIS — Z780 Asymptomatic menopausal state: Secondary | ICD-10-CM | POA: Diagnosis not present

## 2024-10-10 DIAGNOSIS — M85851 Other specified disorders of bone density and structure, right thigh: Secondary | ICD-10-CM | POA: Diagnosis not present

## 2024-10-10 DIAGNOSIS — M85852 Other specified disorders of bone density and structure, left thigh: Secondary | ICD-10-CM | POA: Diagnosis not present

## 2024-10-10 DIAGNOSIS — E2839 Other primary ovarian failure: Secondary | ICD-10-CM | POA: Insufficient documentation

## 2024-10-11 ENCOUNTER — Ambulatory Visit: Payer: Self-pay | Admitting: Radiology

## 2024-11-10 ENCOUNTER — Other Ambulatory Visit (HOSPITAL_BASED_OUTPATIENT_CLINIC_OR_DEPARTMENT_OTHER)

## 2024-11-16 ENCOUNTER — Ambulatory Visit (HOSPITAL_BASED_OUTPATIENT_CLINIC_OR_DEPARTMENT_OTHER)

## 2024-11-16 ENCOUNTER — Ambulatory Visit (INDEPENDENT_AMBULATORY_CARE_PROVIDER_SITE_OTHER): Admitting: Orthopaedic Surgery

## 2024-11-16 ENCOUNTER — Ambulatory Visit (INDEPENDENT_AMBULATORY_CARE_PROVIDER_SITE_OTHER)

## 2024-11-16 ENCOUNTER — Ambulatory Visit (HOSPITAL_BASED_OUTPATIENT_CLINIC_OR_DEPARTMENT_OTHER): Admitting: Orthopaedic Surgery

## 2024-11-16 DIAGNOSIS — M160 Bilateral primary osteoarthritis of hip: Secondary | ICD-10-CM | POA: Diagnosis not present

## 2024-11-16 DIAGNOSIS — M25551 Pain in right hip: Secondary | ICD-10-CM | POA: Diagnosis not present

## 2024-11-16 DIAGNOSIS — R102 Pelvic and perineal pain unspecified side: Secondary | ICD-10-CM

## 2024-11-16 DIAGNOSIS — M1611 Unilateral primary osteoarthritis, right hip: Secondary | ICD-10-CM | POA: Diagnosis not present

## 2024-11-16 DIAGNOSIS — M1612 Unilateral primary osteoarthritis, left hip: Secondary | ICD-10-CM | POA: Diagnosis not present

## 2024-11-16 NOTE — Progress Notes (Signed)
 Chief Complaint: Bilateral hip pain     History of Present Illness:    Julia Dunlap is a 58 y.o. female presents with several years of ongoing bilateral hip pain.  She states that she will experience the pain from the front ranging to the back of the hip.  She does occasionally get some fairly significant right-sided SI pain which makes it extremely difficult to stand up or even walk.  She does enjoy being active.  She does enjoy hiking.  She has been extremely limited as result of this.  She states that despite activity restriction and anti-inflammatories she is continuing to live with pain in the hips and lower back    PMH/PSH/Family History/Social History/Meds/Allergies:    Past Medical History:  Diagnosis Date   Skin cancer    on left leg   Past Surgical History:  Procedure Laterality Date   APPENDECTOMY     CESAREAN SECTION     SKIN CANCER EXCISION Left    leg   Social History   Socioeconomic History   Marital status: Divorced    Spouse name: Not on file   Number of children: Not on file   Years of education: Not on file   Highest education level: Not on file  Occupational History   Not on file  Tobacco Use   Smoking status: Former    Current packs/day: 0.00    Average packs/day: 1 pack/day for 30.0 years (30.0 ttl pk-yrs)    Types: Cigarettes    Start date: 07/21/1988    Quit date: 07/21/2018    Years since quitting: 6.3   Smokeless tobacco: Never  Vaping Use   Vaping status: Former  Substance and Sexual Activity   Alcohol  use: No    Comment: no ETOH use x's 9 years   Drug use: No   Sexual activity: Not Currently    Partners: Male    Birth control/protection: Surgical, Post-menopausal  Other Topics Concern   Not on file  Social History Narrative   Not on file   Social Drivers of Health   Financial Resource Strain: Not on file  Food Insecurity: Not on file  Transportation Needs: Not on file  Physical Activity: Not on file  Stress: Not on file   Social Connections: Not on file   Family History  Problem Relation Age of Onset   Aortic stenosis Mother    Hypertension Mother    Thyroid  disease Mother    COPD Father    Emphysema Father    Hypertension Father    Breast cancer Sister 65   Diabetes Maternal Grandmother    Cancer Maternal Grandfather    Alzheimer's disease Paternal Grandmother    COPD Paternal Grandfather    Emphysema Paternal Grandfather    Colon cancer Neg Hx    Rectal cancer Neg Hx    Stomach cancer Neg Hx    Esophageal cancer Neg Hx    No Known Allergies Current Outpatient Medications  Medication Sig Dispense Refill   Ascorbic Acid (VITAMIN C PO) Take 1 tablet by mouth daily.     Estradiol -Progesterone  (BIJUVA ) 0.5-100 MG CAPS Take 1 capsule by mouth at bedtime. 90 capsule 2   Multiple Vitamin (MULTIVITAMIN) capsule Take 1 capsule by mouth daily.     Omega-3 Fatty Acids (FISH OIL PO) Take 1 tablet by mouth daily.     VITAMIN D  PO Take 1,000 Int'l Units/1.7m2 by mouth.     No current facility-administered medications for this visit.  No results found.  Review of Systems:   A ROS was performed including pertinent positives and negatives as documented in the HPI.  Physical Exam :   Constitutional: NAD and appears stated age Neurological: Alert and oriented Psych: Appropriate affect and cooperative There were no vitals taken for this visit.   Comprehensive Musculoskeletal Exam:    Positive FADIR bilaterally and 30 degrees internal rotation improved with 45 degrees external rotation there is positive passive external rotation with limited recoil on logroll testing.  Mildly antalgic gait.  There is tenderness about the right SI joint   Imaging:   Xray (4 views right hip, 4 views left hip): Left worse than right acetabular undercoverage consistent with hip instability     I personally reviewed and interpreted the radiographs.   Assessment and Plan:   58 y.o. female with left worse than right  evidence of hip instability.  I did discuss that she is also having some evidence of right SI joint irritation for which we will plan to send her to Dr. Burnetta for an SI injection.  I would like to also engage her with physical therapy for strengthening of both of her hips but given the chronicity of the symptoms I do believe that to be ideal to obtain MRIs of both of her hips to rule out any type of underlying tearing   I personally saw and evaluated the patient, and participated in the management and treatment plan.  Julia Parker, MD Attending Physician, Orthopedic Surgery  This document was dictated using Dragon voice recognition software. A reasonable attempt at proof reading has been made to minimize errors.

## 2024-11-21 ENCOUNTER — Encounter (HOSPITAL_BASED_OUTPATIENT_CLINIC_OR_DEPARTMENT_OTHER): Payer: Self-pay | Admitting: Orthopaedic Surgery

## 2024-12-01 ENCOUNTER — Ambulatory Visit: Admitting: Sports Medicine

## 2024-12-01 ENCOUNTER — Other Ambulatory Visit: Payer: Self-pay

## 2024-12-01 ENCOUNTER — Encounter: Payer: Self-pay | Admitting: Sports Medicine

## 2024-12-01 DIAGNOSIS — M2170 Unequal limb length (acquired), unspecified site: Secondary | ICD-10-CM | POA: Diagnosis not present

## 2024-12-01 DIAGNOSIS — M533 Sacrococcygeal disorders, not elsewhere classified: Secondary | ICD-10-CM | POA: Diagnosis not present

## 2024-12-01 DIAGNOSIS — M160 Bilateral primary osteoarthritis of hip: Secondary | ICD-10-CM

## 2024-12-01 DIAGNOSIS — G8929 Other chronic pain: Secondary | ICD-10-CM

## 2024-12-01 DIAGNOSIS — M7918 Myalgia, other site: Secondary | ICD-10-CM

## 2024-12-01 NOTE — Progress Notes (Signed)
 Julia Dunlap - 58 y.o. female MRN [id number]  Date of birth: [birth year]  Office Visit Note: Visit Date: 12/01/2024 PCP: Patient, No Pcp Per Referred by: Genelle Standing, MD  Subjective: Chief Complaint  Patient presents with   Lower Back - Pain   HPI: HOPIE PELLEGRIN is a pleasant 59 y.o. female who presents today for chronic left-sided low back/SI joint pain, bilateral hip pain.  She has been seen previously by my partner, Dr. Genelle.  She has had several years of bilateral hip pain.  X-rays do show mild lateral hip osteoarthritis.  She has pain within the hips as well as over the posterior right SI joint location.  She does enjoy hiking and being active and her pain is limiting this.  She does feel like her hips are off/imbalanced at times.  Using over-the-counter anti-inflammatories as needed.  Pertinent ROS were reviewed with the patient and found to be negative unless otherwise specified above in HPI.   Assessment & Plan: Visit Diagnoses:  1. Chronic right SI joint pain   2. Pain in right buttock   3. Leg length discrepancy   4. Bilateral primary osteoarthritis of hip    Plan: Impression is chronic bilateral hip pain as well as SI joint and pelvic pain.  I do believe her pathology is multifactorial in nature.  X-rays do show bilateral hip mild osteoarthritis.  She certainly has pain emanating from the right SI joint with associated dysfunction.  Evaluating her gait and lying prone, she does have evidence of leg length discrepancy which does modify her biomechanics.  I did fit her for a 5/16 inch heel lift for the right side, she will wear these in her shoes and see how this improves her gait over the coming weeks, she did walk at a much neutral pelvis when testing in office today.  For her right SI joint pain, we did proceed with ultrasound-guided injection, patient tolerated well.  Advised on postinjection protocol.  May use ice/heat as well as over-the-counter anti-inflammatories as  needed for this.  She does have MRI of the right and left hip scheduled tomorrow, she will follow-up with Dr. Genelle regarding this.  I am happy to see her back as needed.  Follow-up: Return if symptoms worsen or fail to improve.   Meds & Orders: No orders of the defined types were placed in this encounter.   Orders Placed This Encounter  Procedures   US  Guided Needle Placement - No Linked Charges     Procedures: U/S-guided SI-joint injection, Right   After discussion of risk/benefits/indications, informed verbal consent was obtained. A timeout was then performed. The patient was positioned in a prone position on exam room table with a pillow placed under the pelvis for mild hip flexion. The SI joint area was cleaned and prepped with betadine and alcohol  swabs. Sterile ultrasound gel was applied and the ultrasound transducer was placed in an anatomic axial plane over the PSIS, then moved distally over the SI-joint. Using ultrasound guidance, a 22-gauge, 3.5 needle was inserted from a medial to lateral approach utilizing an in-plane approach and directed into the SI-joint. The SI-joint was then injected with a mixture of 4:2 lidocaine:depomedrol with visualization of the injectate flow into the SI-joint under ultrasound visualization. The patient tolerated the procedure well without immediate complications.       Clinical History: No specialty comments available.  She reports that she quit smoking about 6 years ago. Her smoking use included cigarettes. She started  smoking about 36 years ago. She has a 30 pack-year smoking history. She has never used smokeless tobacco. No results for input(s): HGBA1C, LABURIC in the last 8760 hours.  Objective:    Physical Exam  Gen: Well-appearing, in no acute distress; non-toxic CV: Well-perfused. Warm.  Resp: Breathing unlabored on room air; no wheezing. Psych: Fluid speech in conversation; appropriate affect; normal thought process  Ortho  Exam - Hips/SI-joints: + TTP overlying the right SI joint just inferior to the PSIS. + Fortin's point test, positive SI joint compression.  There is asymmetry with PSIS flexion/extension test.   - The right leg is functionally short by about 1/4 to 1/3  Imaging:  *Independent review and interpretation of both right and left hip x-rays from 11/16/2024 was performed by myself today.  X-rays demonstrate mild bilateral hip osteoarthritis without any evidence of advanced arthropathy.  There is no notable SI joint sclerosis or arthritic change in this location.  No acute fracture.   11/16/24:  Narrative & Impression  EXAM: 4 OR MORE VIEW(S) XRAY OF THE HIP 11/16/2024 09:54:11 AM   COMPARISON: None available.   CLINICAL HISTORY: pain   FINDINGS:   BONES AND JOINTS: No acute fracture. No malalignment. Mild arthritis of the right hip.   SOFT TISSUES: The soft tissues are unremarkable.   IMPRESSION: 1. Mild arthritis of the right hip.   Electronically signed by: Norman Gatlin MD 11/25/2024 03:30 AM EST RP Workstation: [id number]   Narrative & Impression  EXAM: 4 VIEW(S) XRAY OF THE _LATERALITY_ HIP 11/16/2024 09:54:11 AM   COMPARISON: None available.   CLINICAL HISTORY: pain in pelvis   FINDINGS:   BONES AND JOINTS: No acute fracture. No malalignment. Mild arthritis of the left hip.   SOFT TISSUES: The soft tissues are unremarkable.   IMPRESSION: 1. Mild arthritis of the left hip.   Electronically signed by: Norman Gatlin MD 11/25/2024 03:29 AM EST RP Workstation: [id number]    Past Medical/Family/Surgical/Social History: Medications & Allergies reviewed per EMR, new medications updated. 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   Past Medical History:  Diagnosis Date   Skin cancer    on left leg   Family History  Problem Relation Age of Onset   Aortic stenosis Mother    Hypertension  Mother    Thyroid  disease Mother    COPD Father    Emphysema Father    Hypertension Father    Breast cancer Sister 36   Diabetes Maternal Grandmother    Cancer Maternal Grandfather    Alzheimer's disease Paternal Grandmother    COPD Paternal Grandfather    Emphysema Paternal Grandfather    Colon cancer Neg Hx    Rectal cancer Neg Hx    Stomach cancer Neg Hx    Esophageal cancer Neg Hx    Past Surgical History:  Procedure Laterality Date   APPENDECTOMY     CESAREAN SECTION     SKIN CANCER EXCISION Left    leg   Social History   Occupational History   Not on file  Tobacco Use   Smoking status: Former    Current packs/day: 0.00    Average packs/day: 1 pack/day for 30.0 years (30.0 ttl pk-yrs)    Types: Cigarettes    Start date: 07/21/1988    Quit date: 07/21/2018    Years since quitting: 6.3   Smokeless tobacco: Never  Vaping Use   Vaping status: Former  Substance and Sexual  Activity   Alcohol  use: No    Comment: no ETOH use x's 9 years   Drug use: No   Sexual activity: Not Currently    Partners: Male    Birth control/protection: Surgical, Post-menopausal

## 2024-12-02 ENCOUNTER — Other Ambulatory Visit

## 2024-12-07 ENCOUNTER — Encounter: Payer: Self-pay | Admitting: Radiology

## 2024-12-07 ENCOUNTER — Ambulatory Visit: Admitting: Radiology

## 2024-12-07 VITALS — BP 152/90 | HR 92 | Wt 165.0 lb

## 2024-12-07 DIAGNOSIS — N951 Menopausal and female climacteric states: Secondary | ICD-10-CM | POA: Diagnosis not present

## 2024-12-07 NOTE — Progress Notes (Signed)
° °  Julia Dunlap 06/12/1966 [id number]   History:  58 y.o. G3P3 presents for follow up after starting HRT. She C/o menopausal symptoms, brain fog, fatigue, weight gain, bilateral hip pain. Could not remember the patch, no improvement in symptoms from Bijuva , would like to stop.  Being worked up for hip pain and SI joint pain. Start PT and has an MRI next month.   Gynecologic History No LMP recorded. Patient has had an ablation.   Contraception/Family planning: post menopausal status Sexually active: no Last Pap: 6/23. Results were: normal Last mammogram: 01/05/24. Results were: normal  Obstetric History OB History  Gravida Para Term Preterm AB Living  3    0 3  SAB IAB Ectopic Multiple Live Births    0      # Outcome Date GA Lbr Len/2nd Weight Sex Type Anes PTL Lv  3 Gravida           2 Gravida           1 Gravida                07/08/2024   10:24 AM  Depression screen PHQ 2/9  Decreased Interest 0  Down, Depressed, Hopeless 0  PHQ - 2 Score 0     The following portions of the patient's history were reviewed and updated as appropriate: allergies, current medications, past family history, past medical history, past social history, past surgical history, and problem list.  Review of Systems  All other systems reviewed and are negative.   Past medical history, past surgical history, family history and social history were all reviewed and documented in the EPIC chart.  Exam:  Vitals:   12/07/24 0849 12/07/24 0852  BP: (!) 162/100 (!) 152/90  Pulse: 92   SpO2: 96%   Weight: 165 lb (74.8 kg)    Body mass index is 29.23 kg/m.  Physical Exam Constitutional:      Appearance: Normal appearance. She is normal weight.  Pulmonary:     Effort: Pulmonary effort is normal.  Neurological:     Mental Status: She is alert.  Psychiatric:        Mood and Affect: Mood normal.        Thought Content: Thought content normal.        Judgment: Judgment normal.     Darice Hoit,  CMA present for exam  Assessment/Plan:   1. Menopausal symptoms (Primary) Will stop bijuva  next week when she finishes her current RX Will monitor BP- elevated today. F/u with PCP prn     GINETTE COZIER B WHNP-BC 9:48 AM 12/07/2024

## 2024-12-21 ENCOUNTER — Encounter (HOSPITAL_BASED_OUTPATIENT_CLINIC_OR_DEPARTMENT_OTHER): Payer: Self-pay | Admitting: Orthopaedic Surgery

## 2024-12-26 ENCOUNTER — Ambulatory Visit
Admission: RE | Admit: 2024-12-26 | Discharge: 2024-12-26 | Disposition: A | Discharge status: Home / Self Care | Source: Ambulatory Visit | Attending: Orthopaedic Surgery | Admitting: Orthopaedic Surgery

## 2024-12-26 DIAGNOSIS — R102 Pelvic and perineal pain unspecified side: Secondary | ICD-10-CM

## 2024-12-29 ENCOUNTER — Telehealth (HOSPITAL_BASED_OUTPATIENT_CLINIC_OR_DEPARTMENT_OTHER): Payer: Self-pay | Admitting: Orthopaedic Surgery

## 2024-12-29 NOTE — Telephone Encounter (Signed)
 Do you have her Mri results she comes in tomorrow?

## 2024-12-30 ENCOUNTER — Ambulatory Visit (HOSPITAL_BASED_OUTPATIENT_CLINIC_OR_DEPARTMENT_OTHER): Admitting: Orthopaedic Surgery

## 2024-12-30 DIAGNOSIS — S73199A Other sprain of unspecified hip, initial encounter: Secondary | ICD-10-CM | POA: Diagnosis not present

## 2024-12-30 NOTE — Progress Notes (Signed)
 "   Chief Complaint: Bilateral hip pain     History of Present Illness:   12/30/2024: Presents today for follow-up of bilateral hip MRIs  Julia Dunlap is a 59 y.o. female presents with several years of ongoing bilateral hip pain.  She states that she will experience the pain from the front ranging to the back of the hip.  She does occasionally get some fairly significant right-sided SI pain which makes it extremely difficult to stand up or even walk.  She does enjoy being active.  She does enjoy hiking.  She has been extremely limited as result of this.  She states that despite activity restriction and anti-inflammatories she is continuing to live with pain in the hips and lower back    PMH/PSH/Family History/Social History/Meds/Allergies:    Past Medical History:  Diagnosis Date   Skin cancer    on left leg   Past Surgical History:  Procedure Laterality Date   APPENDECTOMY     CESAREAN SECTION     SKIN CANCER EXCISION Left    leg   Social History   Socioeconomic History   Marital status: Divorced    Spouse name: Not on file   Number of children: Not on file   Years of education: Not on file   Highest education level: Not on file  Occupational History   Not on file  Tobacco Use   Smoking status: Former    Current packs/day: 0.00    Average packs/day: 1 pack/day for 30.0 years (30.0 ttl pk-yrs)    Types: Cigarettes    Start date: 07/21/1988    Quit date: 07/21/2018    Years since quitting: 6.4   Smokeless tobacco: Never  Vaping Use   Vaping status: Former  Substance and Sexual Activity   Alcohol  use: No    Comment: no ETOH use x's 9 years   Drug use: No   Sexual activity: Not Currently    Partners: Male    Birth control/protection: Surgical, Post-menopausal  Other Topics Concern   Not on file  Social History Narrative   Not on file   Social Drivers of Health   Tobacco Use: Medium Risk (12/07/2024)   Patient History    Smoking Tobacco Use: Former     Smokeless Tobacco Use: Never    Passive Exposure: Not on Actuary Strain: Not on file  Food Insecurity: Not on file  Transportation Needs: Not on file  Physical Activity: Not on file  Stress: Not on file  Social Connections: Not on file  Depression (PHQ2-9): Low Risk (07/08/2024)   Depression (PHQ2-9)    PHQ-2 Score: 0  Alcohol  Screen: Not on file  Housing: Not on file  Utilities: Not on file  Health Literacy: Not on file   Family History  Problem Relation Age of Onset   Aortic stenosis Mother    Hypertension Mother    Thyroid  disease Mother    COPD Father    Emphysema Father    Hypertension Father    Breast cancer Sister 50   Diabetes Maternal Grandmother    Cancer Maternal Grandfather    Alzheimer's disease Paternal Grandmother    COPD Paternal Grandfather    Emphysema Paternal Grandfather    Colon cancer Neg Hx    Rectal cancer Neg Hx    Stomach cancer Neg Hx    Esophageal cancer Neg Hx    No Known Allergies Current Outpatient Medications  Medication Sig Dispense Refill   Ascorbic Acid (  VITAMIN C PO) Take 1 tablet by mouth daily.     Estradiol -Progesterone  (BIJUVA ) 0.5-100 MG CAPS Take 1 capsule by mouth at bedtime. 90 capsule 2   Multiple Vitamin (MULTIVITAMIN) capsule Take 1 capsule by mouth daily.     Omega-3 Fatty Acids (FISH OIL PO) Take 1 tablet by mouth daily.     VITAMIN D  PO Take 1,000 Int'l Units/1.7m2 by mouth.     No current facility-administered medications for this visit.   No results found.  Review of Systems:   A ROS was performed including pertinent positives and negatives as documented in the HPI.  Physical Exam :   Constitutional: NAD and appears stated age Neurological: Alert and oriented Psych: Appropriate affect and cooperative There were no vitals taken for this visit.   Comprehensive Musculoskeletal Exam:    Positive FADIR bilaterally and 30 degrees internal rotation improved with 45 degrees external rotation there  is positive passive external rotation with limited recoil on logroll testing.  Mildly antalgic gait.  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   Assessment and Plan:   59 y.o. female with left worse than right evidence of hip instability.  I did discuss treatment options.  At this time she is hoping to defer any type of surgery.  She does have physical therapy scheduled for later this month which I do believe will help with her hips and back.  She will begin with this.  I will plan to see her back as needed should she want to further consider arthroscopic labral intervention   I personally saw and evaluated the patient, and participated in the management and treatment plan.  Elspeth Parker, MD Attending Physician, Orthopedic Surgery  This document was dictated using Dragon voice recognition software. A reasonable attempt at proof reading has been made to minimize errors. "

## 2025-01-19 ENCOUNTER — Encounter: Payer: Self-pay | Admitting: Physical Therapy

## 2025-01-19 ENCOUNTER — Other Ambulatory Visit: Payer: Self-pay

## 2025-01-19 ENCOUNTER — Ambulatory Visit: Attending: Orthopaedic Surgery | Admitting: Physical Therapy

## 2025-01-19 DIAGNOSIS — R102 Pelvic and perineal pain unspecified side: Secondary | ICD-10-CM | POA: Insufficient documentation

## 2025-01-19 DIAGNOSIS — M6281 Muscle weakness (generalized): Secondary | ICD-10-CM | POA: Insufficient documentation

## 2025-01-19 DIAGNOSIS — M25551 Pain in right hip: Secondary | ICD-10-CM | POA: Insufficient documentation

## 2025-01-19 DIAGNOSIS — M25552 Pain in left hip: Secondary | ICD-10-CM | POA: Diagnosis not present

## 2025-01-19 DIAGNOSIS — M5459 Other low back pain: Secondary | ICD-10-CM | POA: Diagnosis not present

## 2025-01-20 NOTE — Addendum Note (Signed)
 Addended by: Gurshan Settlemire on: 01/20/2025 07:57 AM   Modules accepted: Orders

## 2025-02-09 ENCOUNTER — Ambulatory Visit: Payer: Self-pay | Admitting: Physical Therapy

## 2025-02-20 ENCOUNTER — Ambulatory Visit: Admitting: Physical Therapy

## 2025-02-28 ENCOUNTER — Ambulatory Visit: Admitting: Physical Therapy
